# Patient Record
Sex: Female | Born: 1955
Health system: Southern US, Community
[De-identification: ages and names within clinical notes are randomized; demographics above are authoritative.]

## PROBLEM LIST (undated history)

## (undated) DIAGNOSIS — I10 Essential (primary) hypertension: Secondary | ICD-10-CM

## (undated) DIAGNOSIS — F419 Anxiety disorder, unspecified: Secondary | ICD-10-CM

## (undated) DIAGNOSIS — E119 Type 2 diabetes mellitus without complications: Secondary | ICD-10-CM

## (undated) HISTORY — PX: ABDOMINAL HYSTERECTOMY: SHX81

## (undated) HISTORY — DX: Anxiety disorder, unspecified: F41.9

## (undated) HISTORY — PX: FRACTURE SURGERY: SHX138

---

## 1998-05-05 ENCOUNTER — Ambulatory Visit (HOSPITAL_BASED_OUTPATIENT_CLINIC_OR_DEPARTMENT_OTHER): Admission: RE | Admit: 1998-05-05 | Discharge: 1998-05-05 | Payer: Self-pay | Admitting: Orthopedic Surgery

## 1998-07-01 ENCOUNTER — Ambulatory Visit (HOSPITAL_COMMUNITY): Admission: RE | Admit: 1998-07-01 | Discharge: 1998-07-01 | Payer: Self-pay | Admitting: Gastroenterology

## 1998-07-17 ENCOUNTER — Ambulatory Visit (HOSPITAL_COMMUNITY): Admission: RE | Admit: 1998-07-17 | Discharge: 1998-07-17 | Payer: Self-pay | Admitting: Gastroenterology

## 1998-07-17 ENCOUNTER — Encounter: Payer: Self-pay | Admitting: Gastroenterology

## 1998-07-23 ENCOUNTER — Encounter: Payer: Self-pay | Admitting: General Surgery

## 1998-07-24 ENCOUNTER — Encounter: Payer: Self-pay | Admitting: General Surgery

## 1998-07-24 ENCOUNTER — Ambulatory Visit (HOSPITAL_COMMUNITY): Admission: RE | Admit: 1998-07-24 | Discharge: 1998-07-25 | Payer: Self-pay | Admitting: General Surgery

## 2001-04-28 ENCOUNTER — Emergency Department (HOSPITAL_COMMUNITY): Admission: EM | Admit: 2001-04-28 | Discharge: 2001-04-28 | Payer: Self-pay | Admitting: Emergency Medicine

## 2001-04-30 ENCOUNTER — Emergency Department (HOSPITAL_COMMUNITY): Admission: EM | Admit: 2001-04-30 | Discharge: 2001-04-30 | Payer: Self-pay | Admitting: Emergency Medicine

## 2001-05-02 ENCOUNTER — Emergency Department (HOSPITAL_COMMUNITY): Admission: EM | Admit: 2001-05-02 | Discharge: 2001-05-02 | Payer: Self-pay | Admitting: *Deleted

## 2007-11-26 ENCOUNTER — Inpatient Hospital Stay (HOSPITAL_COMMUNITY): Admission: EM | Admit: 2007-11-26 | Discharge: 2007-11-29 | Payer: Self-pay | Admitting: Emergency Medicine

## 2008-01-02 ENCOUNTER — Ambulatory Visit: Payer: Self-pay | Admitting: Internal Medicine

## 2008-01-16 ENCOUNTER — Ambulatory Visit: Payer: Self-pay | Admitting: Internal Medicine

## 2008-01-16 ENCOUNTER — Ambulatory Visit: Payer: Self-pay | Admitting: *Deleted

## 2008-03-12 ENCOUNTER — Ambulatory Visit: Payer: Self-pay | Admitting: Internal Medicine

## 2008-03-12 ENCOUNTER — Encounter (INDEPENDENT_AMBULATORY_CARE_PROVIDER_SITE_OTHER): Payer: Self-pay | Admitting: Family Medicine

## 2008-03-12 LAB — CONVERTED CEMR LAB
ALT: 16 units/L (ref 0–35)
Alkaline Phosphatase: 64 units/L (ref 39–117)
CO2: 27 meq/L (ref 19–32)
LDL Cholesterol: 134 mg/dL — ABNORMAL HIGH (ref 0–99)
Potassium: 4.9 meq/L (ref 3.5–5.3)
Sodium: 141 meq/L (ref 135–145)
Total Bilirubin: 0.4 mg/dL (ref 0.3–1.2)
Total Protein: 7 g/dL (ref 6.0–8.3)
VLDL: 15 mg/dL (ref 0–40)

## 2008-04-23 ENCOUNTER — Ambulatory Visit: Payer: Self-pay | Admitting: Internal Medicine

## 2008-05-07 ENCOUNTER — Ambulatory Visit: Payer: Self-pay | Admitting: Internal Medicine

## 2008-08-15 ENCOUNTER — Emergency Department (HOSPITAL_COMMUNITY): Admission: EM | Admit: 2008-08-15 | Discharge: 2008-08-15 | Payer: Self-pay | Admitting: Emergency Medicine

## 2008-12-11 ENCOUNTER — Ambulatory Visit: Payer: Self-pay | Admitting: Internal Medicine

## 2008-12-11 ENCOUNTER — Encounter (INDEPENDENT_AMBULATORY_CARE_PROVIDER_SITE_OTHER): Payer: Self-pay | Admitting: Family Medicine

## 2008-12-11 LAB — CONVERTED CEMR LAB
ALT: 12 units/L (ref 0–35)
AST: 12 units/L (ref 0–37)
Albumin: 4.6 g/dL (ref 3.5–5.2)
Alkaline Phosphatase: 66 units/L (ref 39–117)
HDL: 53 mg/dL (ref >39)
LDL Cholesterol: 98 mg/dL (ref 0–99)
Potassium: 4.4 meq/L (ref 3.5–5.3)
Sodium: 144 meq/L (ref 135–145)
Total Protein: 7.3 g/dL (ref 6.0–8.3)

## 2008-12-25 ENCOUNTER — Ambulatory Visit: Payer: Self-pay | Admitting: Internal Medicine

## 2008-12-25 ENCOUNTER — Encounter (INDEPENDENT_AMBULATORY_CARE_PROVIDER_SITE_OTHER): Payer: Self-pay | Admitting: Adult Health

## 2008-12-25 LAB — CONVERTED CEMR LAB: Microalb, Ur: 1.28 mg/dL (ref 0.00–1.89)

## 2009-03-24 ENCOUNTER — Ambulatory Visit: Payer: Self-pay | Admitting: Internal Medicine

## 2009-03-24 ENCOUNTER — Encounter (INDEPENDENT_AMBULATORY_CARE_PROVIDER_SITE_OTHER): Payer: Self-pay | Admitting: Adult Health

## 2009-03-25 ENCOUNTER — Ambulatory Visit (HOSPITAL_COMMUNITY): Admission: RE | Admit: 2009-03-25 | Discharge: 2009-03-25 | Payer: Self-pay | Admitting: Family Medicine

## 2009-04-01 ENCOUNTER — Encounter: Admission: RE | Admit: 2009-04-01 | Discharge: 2009-04-01 | Payer: Self-pay | Admitting: Family Medicine

## 2009-05-12 ENCOUNTER — Encounter: Admission: RE | Admit: 2009-05-12 | Discharge: 2009-05-12 | Payer: Self-pay | Admitting: Infectious Diseases

## 2009-06-24 ENCOUNTER — Ambulatory Visit: Payer: Self-pay | Admitting: Internal Medicine

## 2009-06-24 ENCOUNTER — Encounter (INDEPENDENT_AMBULATORY_CARE_PROVIDER_SITE_OTHER): Payer: Self-pay | Admitting: Adult Health

## 2009-06-24 LAB — CONVERTED CEMR LAB
ALT: 9 units/L (ref 0–35)
AST: 13 units/L (ref 0–37)
Calcium: 9.8 mg/dL (ref 8.4–10.5)
Chloride: 108 meq/L (ref 96–112)
Creatinine, Ser: 0.58 mg/dL (ref 0.40–1.20)
Potassium: 4.2 meq/L (ref 3.5–5.3)
Total CHOL/HDL Ratio: 3.2

## 2009-10-12 ENCOUNTER — Ambulatory Visit: Payer: Self-pay | Admitting: Internal Medicine

## 2010-01-20 ENCOUNTER — Ambulatory Visit: Payer: Self-pay | Admitting: Internal Medicine

## 2010-01-20 ENCOUNTER — Encounter (INDEPENDENT_AMBULATORY_CARE_PROVIDER_SITE_OTHER): Payer: Self-pay | Admitting: Adult Health

## 2010-01-20 LAB — CONVERTED CEMR LAB
ALT: 14 units/L (ref 0–35)
AST: 18 units/L (ref 0–37)
Alkaline Phosphatase: 67 units/L (ref 39–117)
Calcium: 9.8 mg/dL (ref 8.4–10.5)
Chloride: 106 meq/L (ref 96–112)
Creatinine, Ser: 0.7 mg/dL (ref 0.40–1.20)
LDL Cholesterol: 101 mg/dL — ABNORMAL HIGH (ref 0–99)
Rhuematoid fact SerPl-aCnc: <20 intl units/mL (ref 0–20)
Sed Rate: 3 mm/hr (ref 0–22)
Total Bilirubin: 0.3 mg/dL (ref 0.3–1.2)
Total CHOL/HDL Ratio: 2.8
Uric Acid, Serum: 4.1 mg/dL (ref 2.4–7.0)
VLDL: 13 mg/dL (ref 0–40)
Vit D, 25-Hydroxy: 29 ng/mL — ABNORMAL LOW (ref 30–89)

## 2010-02-04 ENCOUNTER — Telehealth (INDEPENDENT_AMBULATORY_CARE_PROVIDER_SITE_OTHER): Payer: Self-pay | Admitting: *Deleted

## 2010-04-15 ENCOUNTER — Ambulatory Visit (HOSPITAL_COMMUNITY): Admission: RE | Admit: 2010-04-15 | Discharge: 2010-04-15 | Payer: Self-pay | Admitting: Family Medicine

## 2010-10-19 NOTE — Progress Notes (Signed)
Summary: triage/stomach pain/burning  Phone Note Call from Patient   Caller: Patient Reason for Call: Talk to Nurse Summary of Call: Patient called 5/18 stating she was having severe stomach pains with burning in chest..she described her abd pain as sharp and stated it was unbearable.Marland KitchenMarland KitchenThis all started 24 hours prior.  Patient has a history of HTN and DM..she takes protonix for acid reflux.Marland KitchenMarland KitchenShe states she had a BM today and it was loose.Marland KitchenMarland KitchenAdvised patient to go to UC since we did not have any appointments.Marland KitchenMarland KitchenShe stated she would... 1800  Called patient back with no answer. 02/04/10 Called patient, she is 100% better today.Marland KitchenMarland KitchenDid not go to UC...feels it must have been a virus...patient will call back if needed. Initial call taken by: Conchita Paris,  Feb 04, 2010 11:00 AM

## 2010-11-11 ENCOUNTER — Encounter (INDEPENDENT_AMBULATORY_CARE_PROVIDER_SITE_OTHER): Payer: Self-pay | Admitting: *Deleted

## 2010-11-11 LAB — CONVERTED CEMR LAB
ALT: 17 units/L (ref 0–35)
AST: 17 units/L (ref 0–37)
Albumin: 4.7 g/dL (ref 3.5–5.2)
Alkaline Phosphatase: 82 units/L (ref 39–117)
Basophils Absolute: 0.1 10*3/uL (ref 0.0–0.1)
Basophils Relative: 1 % (ref 0–1)
Calcium: 10.6 mg/dL — ABNORMAL HIGH (ref 8.4–10.5)
Chloride: 102 meq/L (ref 96–112)
Creatinine, Ser: 0.8 mg/dL (ref 0.40–1.20)
Eosinophils Absolute: 0.1 10*3/uL (ref 0.0–0.7)
MCHC: 32.5 g/dL (ref 30.0–36.0)
Neutro Abs: 4.2 10*3/uL (ref 1.7–7.7)
Neutrophils Relative %: 57 % (ref 43–77)
Platelets: 230 10*3/uL (ref 150–400)
Potassium: 4.3 meq/L (ref 3.5–5.3)
RDW: 14.9 % (ref 11.5–15.5)

## 2010-11-23 ENCOUNTER — Encounter: Payer: Self-pay | Admitting: Internal Medicine

## 2011-02-01 NOTE — Discharge Summary (Signed)
Nicole Yates, Nicole Yates                ACCOUNT NO.:  [id number]   MEDICAL RECORD NO.:  [id number]          PATIENT TYPE:  INP   LOCATION:  1304                         FACILITY:  [facility]   PHYSICIAN:  Elliot Cousin, M.D.    DATE OF BIRTH:  02/03/1956   DATE OF ADMISSION:  11/26/2007  DATE OF DISCHARGE:  11/29/2007                               DISCHARGE SUMMARY   DISCHARGE DIAGNOSES:  1. Newly diagnosed type 2 diabetes mellitus.  The patient's venous      glucose at the time of the initial hospital assessment was 1595.  2. Possibly early diabetic ketoacidosis.  3. Hypertension.  4. Hyperlipidemia.  5. Chronic obstructive pulmonary disease.  6. Bilateral adrenal nodules, appear to be benign in nature.  7. Low TSH, and normal free T4.   DISCHARGE MEDICATIONS:  1. Glipizide 10 mg 1/2 tablet b.i.d.  2. Metformin 500 mg b.i.d.  3. NPH insulin 7 units b.i.d.  4. Lisinopril 5 mg daily.  5. Lovastatin 20 mg at bedtime.  6. Combivent MDI 2 puffs q.4 h. as needed.  7. Aspirin 81 mg daily.   DISCHARGE DISPOSITION:  The patient is being discharged to home in  improved and stable condition.  She was advised to follow up with the  [facility] within 3 days.   CONSULTATIONS:  1. Registered dietician.  2. Clinical case Production designer, theatre/television/film.   HISTORY OF PRESENT ILLNESS:  The patient is a 55 year old woman with no  significant past medical history prior to this hospitalization, who  presented to the emergency department complaining of nausea and  vomiting.  When the patient was evaluated in the emergency department,  her blood glucose was found to be greater than 1500.  The patient was  therefore admitted for further evaluation and management.   For additional details, please see the dictated history and physical.   HOSPITAL COURSE:  1. Severe hyperglycemia/newly diagnosed type 2 diabetes mellitus/early      diabetic ketoacidosis.  The patient was started on the insulin drip      Glucommander  protocol.  She was admitted to the ICU for close      observation.  After approximately 24 or more hours on the      Glucommander, it was discontinued.  The patient was subsequently      started on Lantus insulin and sliding-scale NovoLog.  The doses of      Lantus and sliding-scale NovoLog were titrated accordingly and      appropriately.  Subsequently, glipizide and metformin were added to      the regimen.  Over the past 24 hours, the patient's capillary blood      glucose has been ranging from 150-350.  The patient will be      discharged to home on glipizide, metformin, and NPH insulin.      Because of the patient's financial constraints, Lantus was not      prescribed for home because of its expense.  If the patient is able      to see a physician at the [facility] within the next few  days, certainly the Lantus can be restarted and the NPH can be      discontinued.  The NPH is less expressive than Lantus.  During the      hospital course, the patient did receive diabetes teaching with      regards to her disease, diet, and home glucose monitoring.  The      registered dietician and the registered nurse provided the patient      with nutrition education.  The patient was also instructed on self      insulin injections.  She appears to have understood the education      well.  Of note, an aspirin at 81 mg daily was added for      cardioprotection.  The patient's hemoglobin A1c was found to be      elevated at 13.3.  2. Hypertension.  The patient was hypertensive during the hospital      course.  Lisinopril was added for management.  Over the past 24      hours, the patient's blood pressure has been well controlled on      lisinopril.  3. Dyslipidemia/hyperlipidemia.  The patient's fasting lipid profile      was assessed during the hospital course.  The results are as      follows:  Total cholesterol 279, HDL 45, and LDL 205.  The patient      was started on Zocor.  She  will be discharged to home on lovastatin      20 mg at bedtime.  4. COPD.  The patient was treated with Combivent MDI during the      hospitalization.  Per her history, she had recently stopped      smoking.  Her chest x-ray revealed changes consistent with COPD and      emphysema.  5. Nausea and vomiting.  The patient's nausea and vomiting were      thought to be secondary to severe hyperglycemia and early diabetic      ketoacidosis.  An abdominal CT scan was ordered, and it revealed      multiple bilateral adrenal nodules, nonspecific, left      nephrolithiasis, and no acute findings in the abdomen.  The CT scan      of the pelvis revealed no acute findings.  Given the incidental      finding of the adrenal nodules, a dedicated CT scan of the abdomen      was ordered.  Per the radiologist's interpretation, the multiple      bilateral adrenal nodules appeared to be benign in etiology,      possibly adenomas or possibly myelolipomas.  There were no acute      findings on the follow-up CT scan of the adrenal glands.  The      patient's nausea and vomiting resolved once the early DKA and      hyperglycemia resolved.  6. Low TSH, and normal free T4.  The patient's TSH was assessed and      found to be low at 0.255.  Her free T4, however, was within normal      limits at 1.80.  7. The patient  did receive intravenous contrast with the initial CT      scan of the abdomen and pelvis on November 26, 2007.  However, the      metformin was not withheld inadvertently.  The patient's renal      function has remained within normal limits.  Also,  the patient was      advised to go to [facility] to fill her prescriptions, as each      prescription will be $4.  The patient was also advised to follow up      with the [facility].  She also received a 3-day supply of      her medications prior to hospital discharge.   DISCHARGE LABORATORY DATA:  Sodium 138, potassium 3.6, chloride 105, CO2  26,  glucose 119, BUN 12, creatinine 0.6, calcium 8.3.  Hemoglobin 13.7,  platelets 203.  Hemoglobin A1c 13.3.      Elliot Cousin, M.D.  Electronically Signed     DF/MEDQ  D:  11/29/2007  T:  11/30/2007  Job:  [id number]   cc:   Melvern Banker  Fax: [phone]
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
## 2011-04-26 ENCOUNTER — Other Ambulatory Visit (HOSPITAL_COMMUNITY): Payer: Self-pay | Admitting: Family Medicine

## 2011-04-26 DIAGNOSIS — Z1231 Encounter for screening mammogram for malignant neoplasm of breast: Secondary | ICD-10-CM

## 2011-05-03 ENCOUNTER — Ambulatory Visit (HOSPITAL_COMMUNITY): Payer: Self-pay

## 2011-06-13 LAB — URINALYSIS, ROUTINE W REFLEX MICROSCOPIC
Bilirubin Urine: NEGATIVE
Hgb urine dipstick: NEGATIVE
Ketones, ur: NEGATIVE
Nitrite: NEGATIVE
pH: 6

## 2011-06-13 LAB — MAGNESIUM: Magnesium: 2.5

## 2011-06-13 LAB — CBC
HCT: 39.3
HCT: 49.1 — ABNORMAL HIGH
Hemoglobin: 13.7
MCHC: 31.7
MCV: 93.5
RBC: 5.25 — ABNORMAL HIGH
RDW: 14.3
WBC: 10.3

## 2011-06-13 LAB — BASIC METABOLIC PANEL
BUN: 12
CO2: 25
CO2: 26
CO2: 27
CO2: 31
Chloride: 101
Chloride: 105
Chloride: 107
Creatinine, Ser: 0.94
GFR calc Af Amer: >60
GFR calc non Af Amer: >60
GFR calc non Af Amer: >60
Glucose, Bld: 106 — ABNORMAL HIGH
Glucose, Bld: 119 — ABNORMAL HIGH
Glucose, Bld: 137 — ABNORMAL HIGH
Potassium: 3.5
Potassium: 3.6
Potassium: 3.6
Sodium: 135
Sodium: 138
Sodium: 142

## 2011-06-13 LAB — COMPREHENSIVE METABOLIC PANEL
AST: 23
BUN: 21
CO2: 21
Calcium: 9.6
Chloride: 84 — ABNORMAL LOW
Creatinine, Ser: 1.25 — ABNORMAL HIGH
GFR calc Af Amer: 54 — ABNORMAL LOW
GFR calc non Af Amer: 45 — ABNORMAL LOW
Glucose, Bld: 1595
Total Bilirubin: 1.1

## 2011-06-13 LAB — LIPID PANEL
Cholesterol: 279 — ABNORMAL HIGH
HDL: 45

## 2011-06-13 LAB — HEMOGLOBIN A1C
Hgb A1c MFr Bld: 13.3 — ABNORMAL HIGH
Mean Plasma Glucose: 396

## 2011-06-13 LAB — DIFFERENTIAL
Basophils Absolute: 0
Eosinophils Relative: 0
Lymphocytes Relative: 4 — ABNORMAL LOW
Lymphs Abs: 0.5 — ABNORMAL LOW
Neutro Abs: 9.6 — ABNORMAL HIGH
Neutrophils Relative %: 94 — ABNORMAL HIGH

## 2011-06-13 LAB — GLUCOSE, RANDOM
Glucose, Bld: 450 — ABNORMAL HIGH
Glucose, Bld: 703

## 2011-06-13 LAB — TSH: TSH: 0.255 — ABNORMAL LOW

## 2011-11-28 ENCOUNTER — Ambulatory Visit (HOSPITAL_COMMUNITY): Payer: Self-pay

## 2012-01-03 ENCOUNTER — Ambulatory Visit (HOSPITAL_COMMUNITY)
Admission: RE | Admit: 2012-01-03 | Discharge: 2012-01-03 | Disposition: A | Discharge status: Home / Self Care | Payer: Self-pay | Source: Ambulatory Visit | Attending: Family Medicine | Admitting: Family Medicine

## 2012-01-03 DIAGNOSIS — Z1231 Encounter for screening mammogram for malignant neoplasm of breast: Secondary | ICD-10-CM | POA: Insufficient documentation

## 2013-08-09 ENCOUNTER — Encounter (HOSPITAL_COMMUNITY): Payer: Self-pay | Admitting: Emergency Medicine

## 2013-08-09 ENCOUNTER — Emergency Department (HOSPITAL_COMMUNITY): Payer: Self-pay

## 2013-08-09 ENCOUNTER — Emergency Department (HOSPITAL_COMMUNITY)
Admission: EM | Admit: 2013-08-09 | Discharge: 2013-08-09 | Disposition: A | Discharge status: Home / Self Care | Payer: Self-pay | Attending: Emergency Medicine | Admitting: Emergency Medicine

## 2013-08-09 DIAGNOSIS — Y9301 Activity, walking, marching and hiking: Secondary | ICD-10-CM | POA: Insufficient documentation

## 2013-08-09 DIAGNOSIS — S93401A Sprain of unspecified ligament of right ankle, initial encounter: Secondary | ICD-10-CM

## 2013-08-09 DIAGNOSIS — E119 Type 2 diabetes mellitus without complications: Secondary | ICD-10-CM | POA: Insufficient documentation

## 2013-08-09 DIAGNOSIS — S93409A Sprain of unspecified ligament of unspecified ankle, initial encounter: Secondary | ICD-10-CM | POA: Insufficient documentation

## 2013-08-09 DIAGNOSIS — W108XXA Fall (on) (from) other stairs and steps, initial encounter: Secondary | ICD-10-CM | POA: Insufficient documentation

## 2013-08-09 DIAGNOSIS — Z87891 Personal history of nicotine dependence: Secondary | ICD-10-CM | POA: Insufficient documentation

## 2013-08-09 DIAGNOSIS — Y929 Unspecified place or not applicable: Secondary | ICD-10-CM | POA: Insufficient documentation

## 2013-08-09 DIAGNOSIS — I10 Essential (primary) hypertension: Secondary | ICD-10-CM | POA: Insufficient documentation

## 2013-08-09 DIAGNOSIS — X500XXA Overexertion from strenuous movement or load, initial encounter: Secondary | ICD-10-CM | POA: Insufficient documentation

## 2013-08-09 HISTORY — DX: Essential (primary) hypertension: I10

## 2013-08-09 HISTORY — DX: Type 2 diabetes mellitus without complications: E11.9

## 2013-08-09 MED ORDER — TRAMADOL HCL 50 MG PO TABS: 50.0000 mg | ORAL_TABLET | Freq: Four times a day (QID) | ORAL | Status: DC | PRN

## 2013-08-09 NOTE — ED Notes (Signed)
Pt states that she was going down stair when her feet slipped out from under her and fell down last three stairs and hit tile floor. Pt c/o right ankle injury she iced it last nioght and some swelling has subsided but today she isnt able to bear weight.

## 2013-08-09 NOTE — ED Provider Notes (Signed)
Medical screening examination/treatment/procedure(s) were performed by non-physician practitioner and as supervising physician I was immediately available for consultation/collaboration.    Tyashia Morrisette R Torres Hardenbrook, MD 08/09/13 1642 

## 2013-08-09 NOTE — ED Provider Notes (Signed)
CSN: [id number]     Arrival date & time 08/09/13  1035 History   First MD Initiated Contact with Patient 08/09/13 1038     Chief Complaint  Patient presents with  . Ankle Pain    right   (Consider location/radiation/quality/duration/timing/severity/associated sxs/prior Treatment) HPI Comments: This is a 57 year old female who presents today with right ankle pain after a fall last night. She reports she was walking down the stairs when she fell down the last 2-3 stairs. She had an inversion injury. She was able to ambulate on her ankle last night, but this morning woke up and the pain was worse. The pain is a throbbing pain without radiation. She used an Ace bandage for compression last night. She did not take Tylenol or Advil for the pain. She does believe that she has broken this ankle in the past. She otherwise feels well. No fever, chills, nausea, vomiting, numbness, weakness, paresthesias.  Patient is a 57 y.o. female presenting with ankle pain. The history is provided by the patient. No language interpreter was used.  Ankle Pain Associated symptoms: no fever     Past Medical History  Diagnosis Date  . Diabetes mellitus without complication   . Hypertension    Past Surgical History  Procedure Laterality Date  . Abdominal hysterectomy    . Fracture surgery     No family history on file. History  Substance Use Topics  . Smoking status: Former Games developer  . Smokeless tobacco: Not on file  . Alcohol Use: Yes     Comment: social   OB History   Grav Para Term Preterm Abortions TAB SAB Ect Mult Living                 Review of Systems  Constitutional: Negative for fever and chills.  Respiratory: Negative for shortness of breath.   Cardiovascular: Negative for chest pain.  Gastrointestinal: Negative for nausea, vomiting and abdominal pain.  Musculoskeletal: Positive for arthralgias, gait problem and joint swelling.  All other systems reviewed and are negative.    Allergies    Review of patient's allergies indicates not on file.  Home Medications  No current outpatient prescriptions on file. BP 150/95  Pulse 94  Temp(Src) 98.3 F (36.8 C) (Oral)  Resp 18  SpO2 97% Physical Exam  Nursing note and vitals reviewed. Constitutional: She is oriented to person, place, and time. She appears well-developed and well-nourished. No distress.  HENT:  Head: Normocephalic and atraumatic.  Right Ear: External ear normal.  Left Ear: External ear normal.  Nose: Nose normal.  Mouth/Throat: Oropharynx is clear and moist.  Eyes: Conjunctivae are normal.  Neck: Normal range of motion.  Cardiovascular: Normal rate, regular rhythm and normal heart sounds.   Pulmonary/Chest: Effort normal and breath sounds normal. No stridor. No respiratory distress. She has no wheezes. She has no rales.  Abdominal: Soft. She exhibits no distension.  Musculoskeletal: Normal range of motion.       Feet:  Bruising to later aspect of right foot. TTP over medial and lateral malleolus. Joint stable. Compartment soft. Neurovascularly intact. Sensation intact.   Neurological: She is alert and oriented to person, place, and time. She has normal strength.  Skin: Skin is warm and dry. She is not diaphoretic. No erythema.  Psychiatric: She has a normal mood and affect. Her behavior is normal.    ED Course  Procedures (including critical care time) Labs Review Labs Reviewed - No data to display Imaging Review Dg Ankle Complete  Right  08/09/2013   CLINICAL DATA:  Status post fall  EXAM: RIGHT ANKLE - COMPLETE 3+ VIEW  COMPARISON:  08/15/2008  FINDINGS: There is no evidence of fracture, dislocation, or joint effusion. Mild osteoarthritic changes are present otherwise, there is no evidence of arthropathy or other focal bone abnormality. Soft tissues are unremarkable.  IMPRESSION: Negative.   Electronically Signed   By: Salome Holmes M.D.   On: 08/09/2013 11:08    EKG Interpretation   None        MDM   1. Right ankle sprain, initial encounter    Imaging shows no fracture. Directed pt to ice injury, take acetaminophen or ibuprofen for pain, and to elevate and rest the injury when possible. Splinted ankle for support and comfort. She has crutches at home she will use PRN. Neurovascularly intact. Compartment soft. Sensation intact. Return instructions given. Vital signs stable for discharge. Patient / Family / Caregiver informed of clinical course, understand medical decision-making process, and agree with plan.  Mora Bellman, PA-C 08/09/13 1144

## 2013-08-09 NOTE — ED Notes (Signed)
Patient transported to X-ray 

## 2014-06-09 ENCOUNTER — Encounter (HOSPITAL_COMMUNITY): Payer: Self-pay | Admitting: Emergency Medicine

## 2014-06-09 ENCOUNTER — Inpatient Hospital Stay (HOSPITAL_COMMUNITY)
Admission: EM | Admit: 2014-06-09 | Discharge: 2014-06-12 | DRG: 871 | Disposition: A | Discharge status: Home / Self Care | Payer: Self-pay | Attending: Internal Medicine | Admitting: Internal Medicine

## 2014-06-09 ENCOUNTER — Emergency Department (HOSPITAL_COMMUNITY): Payer: Self-pay

## 2014-06-09 DIAGNOSIS — E1110 Type 2 diabetes mellitus with ketoacidosis without coma: Secondary | ICD-10-CM | POA: Diagnosis present

## 2014-06-09 DIAGNOSIS — E1310 Other specified diabetes mellitus with ketoacidosis without coma: Secondary | ICD-10-CM

## 2014-06-09 DIAGNOSIS — I10 Essential (primary) hypertension: Secondary | ICD-10-CM | POA: Diagnosis present

## 2014-06-09 DIAGNOSIS — R739 Hyperglycemia, unspecified: Secondary | ICD-10-CM | POA: Diagnosis present

## 2014-06-09 DIAGNOSIS — J189 Pneumonia, unspecified organism: Secondary | ICD-10-CM | POA: Diagnosis present

## 2014-06-09 DIAGNOSIS — E41 Nutritional marasmus: Secondary | ICD-10-CM | POA: Diagnosis present

## 2014-06-09 DIAGNOSIS — A419 Sepsis, unspecified organism: Principal | ICD-10-CM | POA: Diagnosis present

## 2014-06-09 DIAGNOSIS — IMO0002 Reserved for concepts with insufficient information to code with codable children: Secondary | ICD-10-CM

## 2014-06-09 DIAGNOSIS — E876 Hypokalemia: Secondary | ICD-10-CM | POA: Diagnosis present

## 2014-06-09 DIAGNOSIS — R680 Hypothermia, not associated with low environmental temperature: Secondary | ICD-10-CM | POA: Diagnosis present

## 2014-06-09 DIAGNOSIS — R64 Cachexia: Secondary | ICD-10-CM | POA: Diagnosis present

## 2014-06-09 DIAGNOSIS — E43 Unspecified severe protein-calorie malnutrition: Secondary | ICD-10-CM | POA: Diagnosis present

## 2014-06-09 DIAGNOSIS — K859 Acute pancreatitis without necrosis or infection, unspecified: Secondary | ICD-10-CM | POA: Diagnosis present

## 2014-06-09 DIAGNOSIS — D72829 Elevated white blood cell count, unspecified: Secondary | ICD-10-CM | POA: Diagnosis present

## 2014-06-09 DIAGNOSIS — E871 Hypo-osmolality and hyponatremia: Secondary | ICD-10-CM | POA: Diagnosis present

## 2014-06-09 LAB — I-STAT CHEM 8, ED
BUN: 34 mg/dL — ABNORMAL HIGH (ref 6–23)
CALCIUM ION: 1.13 mmol/L (ref 1.13–1.30)
Chloride: 100 mEq/L (ref 96–112)
Creatinine, Ser: 1 mg/dL (ref 0.50–1.10)
Glucose, Bld: >700 mg/dL (ref 70–99)
HCT: 48 % — ABNORMAL HIGH (ref 36.0–46.0)
HEMOGLOBIN: 16.3 g/dL — AB (ref 12.0–15.0)
Potassium: 4.4 mEq/L (ref 3.7–5.3)
Sodium: 129 mEq/L — ABNORMAL LOW (ref 137–147)
TCO2: 9 mmol/L (ref 0–100)

## 2014-06-09 LAB — BASIC METABOLIC PANEL
ANION GAP: 12 (ref 5–15)
Anion gap: 27 — ABNORMAL HIGH (ref 5–15)
Anion gap: 14 (ref 5–15)
BUN: 33 mg/dL — ABNORMAL HIGH (ref 6–23)
BUN: 28 mg/dL — ABNORMAL HIGH (ref 6–23)
BUN: 26 mg/dL — AB (ref 6–23)
CHLORIDE: 98 meq/L (ref 96–112)
CHLORIDE: 101 meq/L (ref 96–112)
CHLORIDE: 104 meq/L (ref 96–112)
CO2: 10 mEq/L — CL (ref 19–32)
CO2: 19 mEq/L (ref 19–32)
CO2: 18 meq/L — AB (ref 19–32)
CREATININE: 0.7 mg/dL (ref 0.50–1.10)
Calcium: 8.6 mg/dL (ref 8.4–10.5)
Calcium: 9 mg/dL (ref 8.4–10.5)
Calcium: 8.9 mg/dL (ref 8.4–10.5)
Creatinine, Ser: 0.83 mg/dL (ref 0.50–1.10)
Creatinine, Ser: 0.76 mg/dL (ref 0.50–1.10)
GFR calc non Af Amer: 75 mL/min — ABNORMAL LOW (ref >90)
GFR calc non Af Amer: 90 mL/min — ABNORMAL LOW (ref >90)
GFR calc non Af Amer: >90 mL/min (ref >90)
GFR, EST AFRICAN AMERICAN: 87 mL/min — AB (ref >90)
GLUCOSE: 341 mg/dL — AB (ref 70–99)
Glucose, Bld: 169 mg/dL — ABNORMAL HIGH (ref 70–99)
Glucose, Bld: 140 mg/dL — ABNORMAL HIGH (ref 70–99)
POTASSIUM: 4.1 meq/L (ref 3.7–5.3)
POTASSIUM: 4 meq/L (ref 3.7–5.3)
POTASSIUM: 3.9 meq/L (ref 3.7–5.3)
Sodium: 135 mEq/L — ABNORMAL LOW (ref 137–147)
Sodium: 134 mEq/L — ABNORMAL LOW (ref 137–147)
Sodium: 134 mEq/L — ABNORMAL LOW (ref 137–147)

## 2014-06-09 LAB — GLUCOSE, CAPILLARY
GLUCOSE-CAPILLARY: 289 mg/dL — AB (ref 70–99)
GLUCOSE-CAPILLARY: 144 mg/dL — AB (ref 70–99)
GLUCOSE-CAPILLARY: 153 mg/dL — AB (ref 70–99)
Glucose-Capillary: 529 mg/dL — ABNORMAL HIGH (ref 70–99)
Glucose-Capillary: 432 mg/dL — ABNORMAL HIGH (ref 70–99)
Glucose-Capillary: 222 mg/dL — ABNORMAL HIGH (ref 70–99)
Glucose-Capillary: 178 mg/dL — ABNORMAL HIGH (ref 70–99)
Glucose-Capillary: 142 mg/dL — ABNORMAL HIGH (ref 70–99)

## 2014-06-09 LAB — BLOOD GAS, VENOUS
ACID-BASE DEFICIT: 20.8 mmol/L — AB (ref 0.0–2.0)
Bicarbonate: 7.6 mEq/L — ABNORMAL LOW (ref 20.0–24.0)
O2 Saturation: 87.5 %
PCO2 VEN: 23.6 mmHg — AB (ref 45.0–50.0)
PH VEN: 7.135 — AB (ref 7.250–7.300)
PO2 VEN: 70.5 mmHg — AB (ref 30.0–45.0)
Patient temperature: 98.6
TCO2: 7.1 mmol/L (ref 0–100)

## 2014-06-09 LAB — I-STAT TROPONIN, ED: TROPONIN I, POC: 0.01 ng/mL (ref 0.00–0.08)

## 2014-06-09 LAB — COMPREHENSIVE METABOLIC PANEL
ALT: 18 U/L (ref 0–35)
AST: 15 U/L (ref 0–37)
Albumin: 3.7 g/dL (ref 3.5–5.2)
Alkaline Phosphatase: 162 U/L — ABNORMAL HIGH (ref 39–117)
BUN: 39 mg/dL — ABNORMAL HIGH (ref 6–23)
CALCIUM: 9.4 mg/dL (ref 8.4–10.5)
CO2: <7 mEq/L — CL (ref 19–32)
CREATININE: 1 mg/dL (ref 0.50–1.10)
Chloride: 81 mEq/L — ABNORMAL LOW (ref 96–112)
GFR calc Af Amer: 70 mL/min — ABNORMAL LOW (ref >90)
GFR, EST NON AFRICAN AMERICAN: 60 mL/min — AB (ref >90)
GLUCOSE: 795 mg/dL — AB (ref 70–99)
Potassium: 4.6 mEq/L (ref 3.7–5.3)
SODIUM: 130 meq/L — AB (ref 137–147)
TOTAL PROTEIN: 7.4 g/dL (ref 6.0–8.3)
Total Bilirubin: 0.3 mg/dL (ref 0.3–1.2)

## 2014-06-09 LAB — URINALYSIS, ROUTINE W REFLEX MICROSCOPIC
Bilirubin Urine: NEGATIVE
Glucose, UA: >1000 mg/dL — AB
Ketones, ur: >80 mg/dL — AB
LEUKOCYTES UA: NEGATIVE
NITRITE: NEGATIVE
PROTEIN: NEGATIVE mg/dL
SPECIFIC GRAVITY, URINE: 1.027 (ref 1.005–1.030)
UROBILINOGEN UA: 0.2 mg/dL (ref 0.0–1.0)
pH: 5 (ref 5.0–8.0)

## 2014-06-09 LAB — CBG MONITORING, ED: GLUCOSE-CAPILLARY: 549 mg/dL — AB (ref 70–99)

## 2014-06-09 LAB — CBC
HEMATOCRIT: 50.7 % — AB (ref 36.0–46.0)
HEMATOCRIT: 41 % (ref 36.0–46.0)
HEMOGLOBIN: 15 g/dL (ref 12.0–15.0)
Hemoglobin: 17.4 g/dL — ABNORMAL HIGH (ref 12.0–15.0)
MCH: 30.5 pg (ref 26.0–34.0)
MCH: 30.2 pg (ref 26.0–34.0)
MCHC: 34.3 g/dL (ref 30.0–36.0)
MCHC: 36.6 g/dL — AB (ref 30.0–36.0)
MCV: 88.8 fL (ref 78.0–100.0)
MCV: 82.7 fL (ref 78.0–100.0)
Platelets: 287 10*3/uL (ref 150–400)
Platelets: 168 10*3/uL (ref 150–400)
RBC: 5.71 MIL/uL — ABNORMAL HIGH (ref 3.87–5.11)
RBC: 4.96 MIL/uL (ref 3.87–5.11)
RDW: 13.4 % (ref 11.5–15.5)
RDW: 13.2 % (ref 11.5–15.5)
WBC: 18.4 10*3/uL — AB (ref 4.0–10.5)
WBC: 18.7 10*3/uL — ABNORMAL HIGH (ref 4.0–10.5)

## 2014-06-09 LAB — PHOSPHORUS: Phosphorus: 7.5 mg/dL — ABNORMAL HIGH (ref 2.3–4.6)

## 2014-06-09 LAB — I-STAT CG4 LACTIC ACID, ED: Lactic Acid, Venous: 3.74 mmol/L — ABNORMAL HIGH (ref 0.5–2.2)

## 2014-06-09 LAB — URINE MICROSCOPIC-ADD ON

## 2014-06-09 LAB — LIPASE, BLOOD: LIPASE: 216 U/L — AB (ref 11–59)

## 2014-06-09 LAB — PROTIME-INR
INR: 1.09 (ref 0.00–1.49)
PROTHROMBIN TIME: 14.1 s (ref 11.6–15.2)

## 2014-06-09 LAB — APTT: APTT: 24 s (ref 24–37)

## 2014-06-09 LAB — MRSA PCR SCREENING: MRSA by PCR: NEGATIVE

## 2014-06-09 LAB — KETONES, QUALITATIVE

## 2014-06-09 LAB — MAGNESIUM: Magnesium: 2.3 mg/dL (ref 1.5–2.5)

## 2014-06-09 MED ORDER — INSULIN REGULAR HUMAN 100 UNIT/ML IJ SOLN: INTRAMUSCULAR | Status: DC

## 2014-06-09 MED ORDER — SODIUM CHLORIDE 0.9 % IV SOLN: INTRAVENOUS | Status: DC

## 2014-06-09 MED ORDER — SODIUM CHLORIDE 0.9 % IV SOLN
INTRAVENOUS | Status: DC
  Administered 2014-06-09: 5.4 [IU]/h via INTRAVENOUS
  Filled 2014-06-09: qty 2.5

## 2014-06-09 MED ORDER — SODIUM CHLORIDE 0.9 % IV SOLN
1000.0000 mL | Freq: Once | INTRAVENOUS | Status: AC
  Administered 2014-06-09: 1000 mL via INTRAVENOUS

## 2014-06-09 MED ORDER — ONDANSETRON HCL 4 MG/2ML IJ SOLN: 4.0000 mg | Freq: Three times a day (TID) | INTRAMUSCULAR | Status: DC | PRN

## 2014-06-09 MED ORDER — POTASSIUM CHLORIDE 10 MEQ/100ML IV SOLN
10.0000 meq | INTRAVENOUS | Status: AC
  Administered 2014-06-09 (×2): 10 meq via INTRAVENOUS
  Filled 2014-06-09 (×2): qty 100

## 2014-06-09 MED ORDER — ALBUTEROL SULFATE (2.5 MG/3ML) 0.083% IN NEBU: 2.5000 mg | INHALATION_SOLUTION | RESPIRATORY_TRACT | Status: DC | PRN

## 2014-06-09 MED ORDER — DEXTROSE-NACL 5-0.45 % IV SOLN: INTRAVENOUS | Status: DC

## 2014-06-09 MED ORDER — ONDANSETRON HCL 4 MG/2ML IJ SOLN
4.0000 mg | Freq: Once | INTRAMUSCULAR | Status: AC
  Administered 2014-06-09: 4 mg via INTRAVENOUS
  Filled 2014-06-09: qty 2

## 2014-06-09 MED ORDER — TRAMADOL HCL 50 MG PO TABS
50.0000 mg | ORAL_TABLET | Freq: Four times a day (QID) | ORAL | Status: DC | PRN
Start: 2014-06-09 — End: 2014-06-12
  Administered 2014-06-09: 50 mg via ORAL
  Filled 2014-06-09: qty 1

## 2014-06-09 MED ORDER — MORPHINE SULFATE 2 MG/ML IJ SOLN: 1.0000 mg | INTRAMUSCULAR | Status: DC | PRN

## 2014-06-09 MED ORDER — DEXTROSE 5 % IV SOLN
1.0000 g | Freq: Once | INTRAVENOUS | Status: AC
  Administered 2014-06-09: 1 g via INTRAVENOUS
  Filled 2014-06-09: qty 10

## 2014-06-09 MED ORDER — DEXTROSE 5 % IV SOLN
500.0000 mg | Freq: Once | INTRAVENOUS | Status: AC
  Administered 2014-06-09: 500 mg via INTRAVENOUS
  Filled 2014-06-09: qty 500

## 2014-06-09 MED ORDER — DEXTROSE 50 % IV SOLN: 25.0000 mL | INTRAVENOUS | Status: DC | PRN

## 2014-06-09 MED ORDER — SODIUM CHLORIDE 0.9 % IV SOLN
1000.0000 mL | INTRAVENOUS | Status: DC
  Administered 2014-06-09: 1000 mL via INTRAVENOUS

## 2014-06-09 MED ORDER — INFLUENZA VAC SPLIT QUAD 0.5 ML IM SUSY
0.5000 mL | PREFILLED_SYRINGE | INTRAMUSCULAR | Status: AC | PRN
  Administered 2014-06-12: 0.5 mL via INTRAMUSCULAR
  Filled 2014-06-09 (×2): qty 0.5

## 2014-06-09 MED ORDER — DEXTROSE-NACL 5-0.45 % IV SOLN
INTRAVENOUS | Status: DC
  Administered 2014-06-09: 1000 mL via INTRAVENOUS

## 2014-06-09 MED ORDER — ENOXAPARIN SODIUM 40 MG/0.4ML ~~LOC~~ SOLN
40.0000 mg | SUBCUTANEOUS | Status: DC
  Administered 2014-06-09 – 2014-06-11 (×3): 40 mg via SUBCUTANEOUS
  Filled 2014-06-09 (×4): qty 0.4

## 2014-06-09 MED ORDER — HYDRALAZINE HCL 20 MG/ML IJ SOLN
5.0000 mg | INTRAMUSCULAR | Status: DC | PRN
  Administered 2014-06-09 – 2014-06-10 (×2): 5 mg via INTRAVENOUS
  Filled 2014-06-09 (×2): qty 1

## 2014-06-09 MED ORDER — DEXTROSE 5 % IV SOLN
1.0000 g | INTRAVENOUS | Status: DC
  Administered 2014-06-10 – 2014-06-12 (×3): 1 g via INTRAVENOUS
  Filled 2014-06-09 (×3): qty 10

## 2014-06-09 MED ORDER — ONDANSETRON HCL 4 MG/2ML IJ SOLN
4.0000 mg | Freq: Four times a day (QID) | INTRAMUSCULAR | Status: DC | PRN
  Administered 2014-06-10 – 2014-06-12 (×6): 4 mg via INTRAVENOUS
  Filled 2014-06-09 (×6): qty 2

## 2014-06-09 MED ORDER — PANTOPRAZOLE SODIUM 40 MG IV SOLR
40.0000 mg | Freq: Once | INTRAVENOUS | Status: AC
Start: 2014-06-09 — End: 2014-06-09
  Administered 2014-06-09: 40 mg via INTRAVENOUS
  Filled 2014-06-09: qty 40

## 2014-06-09 MED ORDER — DEXTROSE 5 % IV SOLN
500.0000 mg | INTRAVENOUS | Status: DC
  Administered 2014-06-10 – 2014-06-12 (×3): 500 mg via INTRAVENOUS
  Filled 2014-06-09 (×3): qty 500

## 2014-06-09 NOTE — Progress Notes (Signed)
UR completed 

## 2014-06-09 NOTE — H&P (Addendum)
[facility] History and Physical  Nicole Yates YNW:[id number] DOB: 1956/03/29 DOA: 06/09/2014  Referring physician: ED physician PCP: No primary provider on file.   Chief Complaint: cough, weakness   HPI:  Pt is 58 yo female with remote history of diabetes, diet controlled, presented to [facility] ED with main concern of several days duration of progressively worsening productive cough of clear sputum, fevers as high as 100 F, chills, malaise, poor oral intake. This has progressed and she reports several episodes of non bloody vomiting over the past 24 hours prior to this admission. Pt reports she takes no medications and was at one point on diabetic meds but has not taken any over the past year. She denies chest pain, shortness of breath, no other specific abd or urinary concerns. In Ed, pt noted to be slightly hypothermic with T 96.8 F, tachycardic with HR 109, with CBG > 600, initial BMP notable for CO2 < 7, Na 129, WBC 18.7, Hg 16.3, along with other metabolic derangements noted below. TRH asked to admit for further evaluation.   Assessment and Plan: Active Problems:   DKA - with initial AG 22 - admit to SDU - place on glucose stabilizer protocol, insulin drip, IVF - will need 2 runs of KCl via IV  - allow clear liquids for now and monitor AG - BMP Q2 hours until gap closes   Sepsis  - pt satisfies criteria for sepsis given hypothermia, tachycardia, leukocytosis, elevated lactic acid - based on the presenting symptoms appears that source is PNA - will treat with ABX Rocephin and Zithromax  - continue IVF, analgesia as needed - sputum analysis requested, blood and urine culture pending    PNA - based on clinical symptoms but not evident of CXR - will treat with Zithro and Rocephin as noted above - sputum culture, urine legionella and strep penumo requested - continue oxygen as needed    Leukocytosis - likely from PNA - ABX as noted above - repeat CBC in AM   DM, likely  uncontrolled - pending A1C  - diabetic educator consultation requested    HTN, accelerated - place on hydralazine as needed    Hemoconcentration, Hg 17.4 - from dehydration - IVF and repeat CBC in Am   Acute pancreatitis - likely from DKA, pt currently denies abd pain - will provide analgesia and antiemetics as needed  - repeat lipase in AM   Severe PCM - possibly from uncontrolled DM - pt cachectic - will provide nutritional supplements once pt tolerating PO   Hyponatremia - from hyperglycemia  - IVF and repeat BMP in AM  LOVENOX for DVT PROPHYLAXIS  Radiological Exams on Admission: Dg Chest Port 1 View  06/09/2014  No active disease.    Code Status: Full Family Communication: Pt at bedside Disposition Plan: Admit for further evaluation     Review of Systems:  Constitutional: Negative for diaphoresis.  HENT: Negative for hearing loss, ear pain, nosebleeds, congestion, sore throat, neck pain, tinnitus and ear discharge.   Eyes: Negative for blurred vision, double vision, photophobia, pain, discharge and redness.  Respiratory: Negative for wheezing and stridor.   Cardiovascular: Negative for chest pain, palpitations, orthopnea, claudication and leg swelling.  Gastrointestinal: Negative for abdominal pain.  Genitourinary: Negative for hematuria and flank pain.  Musculoskeletal: Negative for myalgias, back pain, joint pain and falls.  Skin: Negative for itching and rash.  Neurological: Negative for dizziness and weakness.  Endo/Heme/Allergies: Negative for environmental allergies and polydipsia. Does not bruise/bleed  easily.  Psychiatric/Behavioral: Negative for suicidal ideas. The patient is not nervous/anxious.      Past Medical History  Diagnosis Date  . Diabetes mellitus without complication   . Hypertension     Past Surgical History  Procedure Laterality Date  . Abdominal hysterectomy    . Fracture surgery      Social History:  reports that she has quit  smoking. She has never used smokeless tobacco. She reports that she drinks alcohol. Her drug history is not on file.  Allergies  Allergen Reactions  . Codeine Other (See Comments)    Childhood allergy, couldn't walk, talk  . Tetanus Toxoids Swelling    No pertinent family medical history   Prior to Admission medications   Medication Sig Start Date End Date Taking? Authorizing Provider  ibuprofen (ADVIL,MOTRIN) 200 MG tablet Take 400 mg by mouth every 6 (six) hours as needed for fever or moderate pain.   Yes Historical Provider, MD  Multiple Vitamin (MULTIVITAMIN WITH MINERALS) TABS tablet Take 1 tablet by mouth daily.   Yes Historical Provider, MD    Physical Exam: Filed Vitals:   06/09/14 1300 06/09/14 1315 06/09/14 1400 06/09/14 1414  BP: 178/85 194/79 162/74   Pulse: 102 101 99   Temp:   97.6 F (36.4 C)   TempSrc:      Resp: Height:     (1.6 m)  Weight:    46.8 kg (103 lb 2.8 oz)  SpO2: 100% 99% 97%     Physical Exam  Constitutional: Appears frail and tired, cachectic  HENT: Normocephalic. External right and left ear normal. Dry MM Eyes: Conjunctivae and EOM are normal. PERRLA, no scleral icterus.  Neck: Normal ROM. Neck supple. No JVD. No tracheal deviation. No thyromegaly.  CVS: Regular rhythm, tachycardic , S1/S2 +, no murmurs, no gallops, no carotid bruit.  Pulmonary: Effort and breath sounds normal, no stridor, mild rhonchi at bases  Abdominal: Soft. BS +,  no distension, tenderness, rebound or guarding.  Musculoskeletal: Normal range of motion. No edema and no tenderness.  Lymphadenopathy: No lymphadenopathy noted, cervical, inguinal. Neuro: Alert. Normal reflexes, muscle tone coordination. No cranial nerve deficit. Skin: Skin is warm and dry. No rash noted. Not diaphoretic. No erythema. No pallor.  Psychiatric: Normal mood and affect. Behavior, judgment, thought content normal.   Labs on Admission:  Basic Metabolic Panel:  Recent Labs Lab  06/09/14 1029 06/09/14 1030 06/09/14 1107  NA 130*  --  129*  K 4.6  --  4.4  CL 81*  --  100  CO2 <7*  --   --   GLUCOSE 795*  --  >700*  BUN 39*  --  34*  CREATININE 1.00  --  1.00  CALCIUM 9.4  --   --   MG  --  2.3  --   PHOS  --  7.5*  --    Liver Function Tests:  Recent Labs Lab 06/09/14 1029  AST 15  ALT 18  ALKPHOS 162*  BILITOT 0.3  PROT 7.4  ALBUMIN 3.7    Recent Labs Lab 06/09/14 1030  LIPASE 216*    CBC:  Recent Labs Lab 06/09/14 0901 06/09/14 1107  WBC 18.4*  --   HGB 17.4* 16.3*  HCT 50.7* 48.0*  MCV 88.8  --   PLT 287  --    CBG:  Recent Labs Lab 06/09/14 0834 06/09/14 1151 06/09/14 1300  GLUCAP >600* >600* 549*    EKG:  Normal sinus rhythm, no ST/T wave changes  Debbora Presto, MD  [facility] Pager [phone]  If 7PM-7AM, please contact night-coverage [email or web address] Password [email or web address] 06/09/2014, 2:52 PM

## 2014-06-09 NOTE — ED Notes (Signed)
Made Respiratory Tech that VBG needs to be ran

## 2014-06-09 NOTE — ED Notes (Signed)
Pt c/o not feeling well since Friday. Pt states that she has had fever (taking ibuprofen for it, non-productive cough, and vomiting. Pt CBG >600.

## 2014-06-09 NOTE — ED Provider Notes (Addendum)
CSN: [id number]     Arrival date & time 06/09/14  4098 History   First MD Initiated Contact with Patient 06/09/14 [phone]     Chief Complaint  Patient presents with  . Emesis  . Hyperglycemia  . Fever     (Consider location/radiation/quality/duration/timing/severity/associated sxs/prior Treatment) HPI The patient has been ill for approximately 4 days. She reports the symptoms started mild as cold symptoms with some cough and small amount of phlegm production. She reports 2 days ago she had a fever that was over 100. She reports it didn't seem like she was that sick but then 3 days ago she started vomiting. At onset it was only a few times but as of last night she threw up 7-8 times and now is having dry heaves. She is very weak and fatigued feeling. She denies any specific pain associated with this illness. She however feels awful. The patient has been off of her medications for approximately a year being unable to afford her diabetic medications. She reports also she was trying to be very compliant with diet and has lost 70 pounds and was hoping to control it with dietary means. The reported blood sugar prior to arrival is greater than 600. The patient reports she has not really been monitoring it very much over this past year. She only describes to me that it has been up and down but doesn't give specific numbers. The patient is denying chest pain, shortness of breath, any localizing abdominal pain or headache.  Past Medical History  Diagnosis Date  . Diabetes mellitus without complication   . Hypertension    Past Surgical History  Procedure Laterality Date  . Abdominal hysterectomy    . Fracture surgery     No family history on file. History  Substance Use Topics  . Smoking status: Former Games developer  . Smokeless tobacco: Not on file  . Alcohol Use: Yes     Comment: social   OB History   Grav Para Term Preterm Abortions TAB SAB Ect Mult Living                 Review of Systems  10  Systems reviewed and are negative for acute change except as noted in the HPI.   Allergies  Codeine and Tetanus toxoids  Home Medications   Prior to Admission medications   Medication Sig Start Date End Date Taking? Authorizing Provider  ibuprofen (ADVIL,MOTRIN) 200 MG tablet Take 400 mg by mouth every 6 (six) hours as needed for fever or moderate pain.   Yes Historical Provider, MD  Multiple Vitamin (MULTIVITAMIN WITH MINERALS) TABS tablet Take 1 tablet by mouth daily.   Yes Historical Provider, MD   BP 198/80  Pulse 104  Temp(Src) 96.8 F (36 C) (Rectal)  Resp 19  SpO2 99% Physical Exam The patient is an acutely ill-appearing 58 year old female. She is thin borderline cachectic. The skin is pale. She is holding an emesis bag and having some retching. Her mental status however is awake and appropriate giving full history with clear and cognitively intact speech. Head face: Normocephalic atraumatic Eyes: Slightly sunken extraocular motions are intact pupils are symmetric and responsive Nares: Patent no discharge Oral cavity: Mucous membranes are dry posterior records widely patent Neck: Supple no meningismus Lungs: Clear to auscultation no gross wheeze rhonchi rail. No acute respiratory distress Heart: Regular, no gross rub murmur Abdomen: Soft. Mild epigastric tenderness to palpation. No palpable mass. Back: Normal visual inspection Extremities: Thin. Lower extremities no peripheral  edema. No calf tenderness. Skin condition of the lower extremities is excellent without any wounds or cellulitis.  Skin: Cool, pale, dry. Neurologic: The patient's mental status is intact with normal cognitive function. She does have good recall. All movements are coordinated purposeful and symmetric. With normal expected motor function.   ED Course  Procedures (including critical care time) Labs Review Labs Reviewed  CBC - Abnormal; Notable for the following:    WBC 18.4 (*)    RBC 5.71 (*)     Hemoglobin 17.4 (*)    HCT 50.7 (*)    All other components within normal limits  LIPASE, BLOOD - Abnormal; Notable for the following:    Lipase 216 (*)    All other components within normal limits  PHOSPHORUS - Abnormal; Notable for the following:    Phosphorus 7.5 (*)    All other components within normal limits  KETONES, QUALITATIVE - Abnormal; Notable for the following:    Acetone, Bld MODERATE (*)    All other components within normal limits  COMPREHENSIVE METABOLIC PANEL - Abnormal; Notable for the following:    Sodium 130 (*)    Chloride 81 (*)    CO2 <7 (*)    Glucose, Bld 795 (*)    BUN 39 (*)    Alkaline Phosphatase 162 (*)    GFR calc non Af Amer 60 (*)    GFR calc Af Amer 70 (*)    All other components within normal limits  BLOOD GAS, VENOUS - Abnormal; Notable for the following:    pH, Ven 7.135 (*)    pCO2, Ven 23.6 (*)    pO2, Ven 70.5 (*)    Bicarbonate 7.6 (*)    Acid-base deficit 20.8 (*)    All other components within normal limits  I-STAT CG4 LACTIC ACID, ED - Abnormal; Notable for the following:    Lactic Acid, Venous 3.74 (*)    All other components within normal limits  I-STAT CHEM 8, ED - Abnormal; Notable for the following:    Sodium 129 (*)    BUN 34 (*)    Glucose, Bld >700 (*)    Hemoglobin 16.3 (*)    HCT 48.0 (*)    All other components within normal limits  CULTURE, BLOOD (ROUTINE X 2)  CULTURE, BLOOD (ROUTINE X 2)  APTT  PROTIME-INR  MAGNESIUM  URINALYSIS, ROUTINE W REFLEX MICROSCOPIC  CBG MONITORING, ED  Rosezena Sensor, ED    Imaging Review Dg Chest Port 1 View  06/09/2014   CLINICAL DATA:  Nausea, vomiting, cough  EXAM: PORTABLE CHEST - 1 VIEW  COMPARISON:  11/27/2007  FINDINGS: The lungs are hyperinflated likely secondary to COPD. There is no focal parenchymal opacity, pleural effusion, or pneumothorax. The heart and mediastinal contours are unremarkable.  The osseous structures are unremarkable.  IMPRESSION: No active disease.    Electronically Signed   By: Elige Ko   On: 06/09/2014 10:13     EKG Interpretation None     CRITICAL CARE Performed by: Arby Barrette   Total critical care time: 90  Critical care time was exclusive of separately billable procedures and treating other patients.  Critical care was necessary to treat or prevent imminent or life-threatening deterioration.  Critical care was time spent personally by me on the following activities: development of treatment plan with patient and/or surrogate as well as nursing, discussions with consultants, evaluation of patient's response to treatment, examination of patient, obtaining history from patient or surrogate, ordering and  performing treatments and interventions, ordering and review of laboratory studies, ordering and review of radiographic studies, pulse oximetry and re-evaluation of patient's condition.  MDM   Final diagnoses:  Diabetic ketoacidosis without coma associated with type 2 diabetes mellitus   the patient presents as outlined above with diagnostic studies confirmatory for a DKA in an untreated type II diabetic. Symptoms appear to started with a respiratory illness possibly a pneumonia or atypical pneumonia with cough and fever. The chest x-ray was interpreted as negative. However based on the patient's history and this as potential incipient illness, she's been treated Rocephin and Zithromax. The patient is acutely acidotic and hyperglycemic. She is also ketone positive. Hydration has been initiated in the emergency department with 3 L of fluids and initiation of insulin drip. Nausea control has been instituted with Zofran. The patient still has ill appearance however her mental status is awake and oriented, appropriately responsive to commands.     Arby Barrette, MD 06/09/14 1225  Patient reassessment. 3 L of fluids have infused and insulin drip has been initiated I reassessed the patient for her general condition she still in  the emergency department. The patient reports subjective improvement. She feels less nauseated and slightly improved. She continues to have ill appearance but is awake and responding appropriately to questions and commands.  Arby Barrette, MD 06/09/14 [phone]

## 2014-06-09 NOTE — ED Notes (Signed)
Patient is aware that we need a urine specimen.  

## 2014-06-09 NOTE — Progress Notes (Signed)
[facility] Community Liaison Stacy,  ° °Provided pt with a list of primary care resources and a [facility] Orange Card application to help patient establish primary care.  °

## 2014-06-10 DIAGNOSIS — J189 Pneumonia, unspecified organism: Secondary | ICD-10-CM

## 2014-06-10 DIAGNOSIS — E876 Hypokalemia: Secondary | ICD-10-CM

## 2014-06-10 DIAGNOSIS — D72829 Elevated white blood cell count, unspecified: Secondary | ICD-10-CM

## 2014-06-10 LAB — HEMOGLOBIN A1C
Hgb A1c MFr Bld: 19.1 % — ABNORMAL HIGH (ref <5.7)
MEAN PLASMA GLUCOSE: 501 mg/dL — AB (ref <117)

## 2014-06-10 LAB — LIPASE, BLOOD: LIPASE: 27 U/L (ref 11–59)

## 2014-06-10 LAB — GLUCOSE, CAPILLARY
GLUCOSE-CAPILLARY: 113 mg/dL — AB (ref 70–99)
GLUCOSE-CAPILLARY: 148 mg/dL — AB (ref 70–99)
GLUCOSE-CAPILLARY: 265 mg/dL — AB (ref 70–99)
Glucose-Capillary: 110 mg/dL — ABNORMAL HIGH (ref 70–99)
Glucose-Capillary: 117 mg/dL — ABNORMAL HIGH (ref 70–99)
Glucose-Capillary: 135 mg/dL — ABNORMAL HIGH (ref 70–99)
Glucose-Capillary: 138 mg/dL — ABNORMAL HIGH (ref 70–99)
Glucose-Capillary: 153 mg/dL — ABNORMAL HIGH (ref 70–99)
Glucose-Capillary: 159 mg/dL — ABNORMAL HIGH (ref 70–99)
Glucose-Capillary: 165 mg/dL — ABNORMAL HIGH (ref 70–99)
Glucose-Capillary: 178 mg/dL — ABNORMAL HIGH (ref 70–99)
Glucose-Capillary: 191 mg/dL — ABNORMAL HIGH (ref 70–99)
Glucose-Capillary: 198 mg/dL — ABNORMAL HIGH (ref 70–99)
Glucose-Capillary: 220 mg/dL — ABNORMAL HIGH (ref 70–99)
Glucose-Capillary: 224 mg/dL — ABNORMAL HIGH (ref 70–99)
Glucose-Capillary: 240 mg/dL — ABNORMAL HIGH (ref 70–99)
Glucose-Capillary: 281 mg/dL — ABNORMAL HIGH (ref 70–99)
Glucose-Capillary: 294 mg/dL — ABNORMAL HIGH (ref 70–99)

## 2014-06-10 LAB — BASIC METABOLIC PANEL
Anion gap: 13 (ref 5–15)
Anion gap: 12 (ref 5–15)
BUN: 23 mg/dL (ref 6–23)
BUN: 21 mg/dL (ref 6–23)
CHLORIDE: 101 meq/L (ref 96–112)
CO2: 19 mEq/L (ref 19–32)
CO2: 20 meq/L (ref 19–32)
Calcium: 9 mg/dL (ref 8.4–10.5)
Calcium: 9.1 mg/dL (ref 8.4–10.5)
Chloride: 104 mEq/L (ref 96–112)
Creatinine, Ser: 0.67 mg/dL (ref 0.50–1.10)
Creatinine, Ser: 0.63 mg/dL (ref 0.50–1.10)
GFR calc Af Amer: >90 mL/min (ref >90)
GFR calc Af Amer: >90 mL/min (ref >90)
GFR calc non Af Amer: >90 mL/min (ref >90)
GLUCOSE: 154 mg/dL — AB (ref 70–99)
Glucose, Bld: 121 mg/dL — ABNORMAL HIGH (ref 70–99)
POTASSIUM: 3.5 meq/L — AB (ref 3.7–5.3)
Potassium: 3.9 mEq/L (ref 3.7–5.3)
Sodium: 136 mEq/L — ABNORMAL LOW (ref 137–147)
Sodium: 133 mEq/L — ABNORMAL LOW (ref 137–147)

## 2014-06-10 LAB — CBC
HEMATOCRIT: 37.3 % (ref 36.0–46.0)
HEMOGLOBIN: 13.5 g/dL (ref 12.0–15.0)
MCH: 29.3 pg (ref 26.0–34.0)
MCHC: 36.2 g/dL — ABNORMAL HIGH (ref 30.0–36.0)
MCV: 81.1 fL (ref 78.0–100.0)
Platelets: 243 10*3/uL (ref 150–400)
RBC: 4.6 MIL/uL (ref 3.87–5.11)
RDW: 13 % (ref 11.5–15.5)
WBC: 18.7 10*3/uL — AB (ref 4.0–10.5)

## 2014-06-10 LAB — EXPECTORATED SPUTUM ASSESSMENT W REFEX TO RESP CULTURE

## 2014-06-10 LAB — STREP PNEUMONIAE URINARY ANTIGEN: Strep Pneumo Urinary Antigen: NEGATIVE

## 2014-06-10 MED ORDER — LIVING WELL WITH DIABETES BOOK
Freq: Once | Status: AC
  Administered 2014-06-10: 1
  Filled 2014-06-10: qty 1

## 2014-06-10 MED ORDER — INSULIN ASPART 100 UNIT/ML ~~LOC~~ SOLN: 0.0000 [IU] | SUBCUTANEOUS | Status: DC

## 2014-06-10 MED ORDER — PROMETHAZINE HCL 25 MG/ML IJ SOLN
12.5000 mg | Freq: Four times a day (QID) | INTRAMUSCULAR | Status: DC | PRN
  Administered 2014-06-12: 12.5 mg via INTRAVENOUS
  Filled 2014-06-10: qty 1

## 2014-06-10 MED ORDER — HYDRALAZINE HCL 20 MG/ML IJ SOLN
10.0000 mg | Freq: Four times a day (QID) | INTRAMUSCULAR | Status: DC | PRN
Start: 2014-06-10 — End: 2014-06-12
  Administered 2014-06-10: 10 mg via INTRAVENOUS
  Filled 2014-06-10: qty 1

## 2014-06-10 MED ORDER — SODIUM CHLORIDE 0.9 % IV SOLN
INTRAVENOUS | Status: DC
Start: 2014-06-10 — End: 2014-06-12
  Administered 2014-06-10 – 2014-06-11 (×3): via INTRAVENOUS

## 2014-06-10 MED ORDER — GLUCERNA SHAKE PO LIQD
237.0000 mL | Freq: Two times a day (BID) | ORAL | Status: DC
  Administered 2014-06-10 – 2014-06-12 (×4): 237 mL via ORAL
  Filled 2014-06-10 (×5): qty 237

## 2014-06-10 MED ORDER — INSULIN ASPART 100 UNIT/ML ~~LOC~~ SOLN
0.0000 [IU] | SUBCUTANEOUS | Status: DC
  Administered 2014-06-10 (×2): 8 [IU] via SUBCUTANEOUS
  Administered 2014-06-11: 5 [IU] via SUBCUTANEOUS
  Administered 2014-06-11: 8 [IU] via SUBCUTANEOUS
  Administered 2014-06-11: 3 [IU] via SUBCUTANEOUS
  Administered 2014-06-11: 8 [IU] via SUBCUTANEOUS
  Administered 2014-06-11: 5 [IU] via SUBCUTANEOUS
  Administered 2014-06-12: 2 [IU] via SUBCUTANEOUS
  Administered 2014-06-12: 3 [IU] via SUBCUTANEOUS
  Administered 2014-06-12: 11 [IU] via SUBCUTANEOUS
  Administered 2014-06-12: 5 [IU] via SUBCUTANEOUS

## 2014-06-10 MED ORDER — INSULIN GLARGINE 100 UNIT/ML ~~LOC~~ SOLN
5.0000 [IU] | Freq: Once | SUBCUTANEOUS | Status: AC
  Administered 2014-06-10: 5 [IU] via SUBCUTANEOUS
  Filled 2014-06-10: qty 0.05

## 2014-06-10 MED ORDER — AMLODIPINE BESYLATE 5 MG PO TABS
5.0000 mg | ORAL_TABLET | Freq: Every day | ORAL | Status: DC
  Administered 2014-06-10 – 2014-06-12 (×3): 5 mg via ORAL
  Filled 2014-06-10 (×3): qty 1

## 2014-06-10 MED ORDER — INSULIN STARTER KIT- SYRINGES (ENGLISH)
1.0000 | Freq: Once | Status: AC
  Administered 2014-06-10: 1
  Filled 2014-06-10: qty 1

## 2014-06-10 NOTE — Progress Notes (Signed)
Inpatient Diabetes Program Recommendations  AACE/ADA: New Consensus Statement on Inpatient Glycemic Control (2013)  Target Ranges:  Prepandial:   less than 140 mg/dL      Peak postprandial:   less than 180 mg/dL (1-2 hours)      Critically ill patients:  140 - 180 mg/dL   Reason for Visit: Diabetes Consult  Diabetes history: DM2 Outpatient Diabetes medications: None - previously on Lantus, metformin, glipizide Current orders for Inpatient glycemic control: Lantus 5 units once, still on GlucoStabilizer  58 year old female presents to [facility] with cough, weakness and vomiting. Hx DM2 diagnosed in 2012. Has not taken diabetes meds in over a year d/t finances. Has lost 70# in the past year. Found to be in DKA with initial AG of 22. Placed on Glucostabilizer. Recent labs show AG 12, CO2 20. Given Lantus 5 units. Eating CL diet. Will begin transitioning to Lantus and Novolog.   Inpatient Diabetes Program Recommendations Insulin - Basal: Lantus 5 units bid Correction (SSI): Novolog sensitive Q4 x 12 hours then tidwc and hs Insulin - Meal Coverage: May need small amount of meal coverage insulin when po intake increases HgbA1C: HgbA1C pending Diet: When advanced CHO mod med  Note: Pt interested in [facility] for MD to follow her diabetes. Has glucose meter, but needs strips and lancets.  Will order Living Well With Diabetes book and encouraged pt to view diabetes videos on pt ed channel. Interested in OP Diabetes Education and seems motivated to start taking care of herself with regards to diabetes diagnosis. Will order insulin starter kit and RN to review insulin administration.  Will follow daily. Thank you. Rhonda Vaughan, RD, LDN, CDE Inpatient Diabetes Coordinator [phone]    

## 2014-06-10 NOTE — Progress Notes (Signed)
INITIAL NUTRITION ASSESSMENT  DOCUMENTATION CODES Per approved criteria  -Not Applicable   INTERVENTION: - Glucerna shakes BID - RD to continue to monitor   NUTRITION DIAGNOSIS: Altered nutrition related laboratory values related to DKA as evidenced by MD notes.   Goal: 1. CBGs WNL 2. Pt to consume >90% of meals/supplements  Monitor:  Weights, labs, intake   Reason for Assessment: Malnutrition screening tool   58 y.o. female  Admitting Dx: DKA, type 2  ASSESSMENT: Pt with remote history of diabetes, diet controlled, presented to [facility] ED with main concern of several days duration of progressively worsening productive cough of clear sputum, fevers as high as 100 F, chills, malaise, poor oral intake. This has progressed and she reports several episodes of non bloody vomiting over the past 24 hours prior to this admission. Pt reports she takes no medications and was at one point on diabetic meds but has not taken any over the past year.   - Found to have diabetic ketoacidosis, sepsis, and PNA - Pt alone and asleep in room - Observed pt had a few bites of jello from tray - Per diabetic coordinator notes, pt lost 70 pounds in the past year  - No current HbA1c available    Height: Ht Readings from Last 1 Encounters:  06/09/14  (1.6 m)    Weight: Wt Readings from Last 1 Encounters:  06/10/14 109 lb 2 oz (49.5 kg)    Ideal Body Weight: 115 lbs   % Ideal Body Weight: 95%  Wt Readings from Last 10 Encounters:  06/10/14 109 lb 2 oz (49.5 kg)    Usual Body Weight: 179 lbs   % Usual Body Weight: 61%  BMI:  Body mass index is 19.34 kg/(m^2).  Estimated Nutritional Needs: Kcal: 1300-1500 Protein: 60-80g Fluid: 1.3-1.5L/day   Skin: intact   Diet Order:  Heart healthy/CHO modified   EDUCATION NEEDS: -No education needs identified at this time   Intake/Output Summary (Last 24 hours) at 06/10/14 1353 Last data filed at 06/10/14 1200  Gross per 24 hour   Intake 2311.65 ml  Output    400 ml  Net 1911.65 ml    Last BM: 9/21  Labs:   Recent Labs Lab 06/09/14 1030  06/09/14 2056 06/10/14 0051 06/10/14 0256  NA  --   < > 134* 136* 133*  K  --   < > 3.9 3.9 3.5*  CL  --   < > 104 104 101  CO2  --   < > 18* 19 20  BUN  --   < > 26* 23 21  CREATININE  --   < > 0.70 0.67 0.63  CALCIUM  --   < > 8.9 9.0 9.1  MG 2.3  --   --   --   --   PHOS 7.5*  --   --   --   --   GLUCOSE  --   < > 140* 121* 154*  < > = values in this interval not displayed.  CBG (last 3)   Recent Labs  06/10/14 0826 06/10/14 0928 06/10/14 1025  GLUCAP 159* 220* 178*    Scheduled Meds: . amLODipine  5 mg Oral Daily  . azithromycin  500 mg Intravenous Q24H  . cefTRIAXone (ROCEPHIN)  IV  1 g Intravenous Q24H  . enoxaparin (LOVENOX) injection  40 mg Subcutaneous Q24H    Continuous Infusions: . sodium chloride Stopped (06/09/14 1719)  . sodium chloride 75 mL/hr at 06/10/14  1215  . dextrose 5 % and 0.45% NaCl 1,000 mL (06/09/14 1719)  . insulin (NOVOLIN-R) infusion Stopped (06/10/14 1027)    Past Medical History  Diagnosis Date  . Diabetes mellitus without complication   . Hypertension     Past Surgical History  Procedure Laterality Date  . Abdominal hysterectomy    . Fracture surgery      Charlott Rakes MS, RD, LDN [phone] Pager [phone] Weekend/After Hours Pager

## 2014-06-10 NOTE — Progress Notes (Signed)
Patient ID: Nicole Yates, female   DOB: July 06, 1956, 58 y.o.   MRN: [id number] [facility] PROGRESS NOTE  HENRETTA QUIST PIR:[id number] DOB: 1955-11-13 DOA: 06/09/2014 PCP: No primary provider on file.  Brief narrative: 58 year old female with remote history of diabetes, diet controlled who presented to [facility] ED 06/09/2014 with progressively worsening cough productive of clear sputum, fevers, chills and malaise. Patient also reported poor oral intake and has had several episodes of nonbloody vomiting over past 24 hours prior to this admission. On admission, patient was noted to be slightly hypothermic withT 96.8 F, tachycardic with HR 109, with CBG > 600, initial BMP notable for CO2 < 7, Na 129, WBC 18.7, Hg 16.3. Chest x-ray did not show acute cardiopulmonary findings. Patient was admitted to step down unit for management of diabetic ketoacidosis. Patient was started on azithromycin and Rocephin for treatment of possible pneumonia.  Assessment/Plan:    Principal Problem:  DKA / Diabetes mellitus type 2 likely uncontrolled   Pt admitted to SDU for management of DKA. Initial blood work showed CBG more than 600, CO2 less than 7, AG 22; blood glucose on BMP more than 700, lactic acid 3.74, moderate ketones, on pH acidemia and on UA ketone more than 80.   DKA protocol initiated. Continue insulin drip. Continue IV fluids, CBG every 1 hour and BMP every 2 hours until AG closes or CO2 normalizes.  Appreciate diabetic coordinator consult  Check A1c Active Problems: Sepsis   Sepsis criteria met with initial vitals that included hypothermia, tachycardia, leukocytosis, elevated lactic acid   Likely source pneumonia based on clinical presentation.  Started azithromycin and rocephin.  Follow up blood culture results, resp culture results, urine culture results. Community acquired pneumonia / Leukocytosis  Based on clinical symptoms but not evident of CXR   Will treat with Zithro and Rocephin as  noted above   Follow up sputum culture, urine legionella and strep penumo   Oxygen support vai nasal canula to keep O2 saturation above 90% HTN, accelerated   Start Norvasc 5 mg daily and add hydralazine 10 mg IV every 6 hours PRN BP above 140/90 Hemoconcentration, Hg 17.4   From dehydration   Improved with IV fluids  Acute pancreatitis   Likely from DKA, pt currently denies abd pain   Lipase level on admission 216 but subsequently normalized  Severe PCM   Possibly from uncontrolled DM   Pt cachetic  Nutrition consulted  Hyponatremia   From hyperglycemia   Continue IV fluids, sodium ranges 133 - 136   DVT Prophylaxis   Lovenox subQ  Code Status: Full.  Family Communication:  plan of care discussed with the patient Disposition Plan: Home when stable. We will keep patient in step down unit. Nurse will notify once patient off of insulin drip at which time we will put the order to transfer patient to floor.   IV Access:   Peripheral IV Procedures and diagnostic studies:    Dg Chest Port 1 View  06/09/2014   No active disease.   Electronically Signed   By: Kathreen Devoid   On: 06/09/2014 10:13  Medical Consultants:   None  Other Consultants:   Diabetic coordinator Anti-Infectives:   Azithromycin 06/09/2014 --> Rocephin 06/09/2014 -->   Leisa Lenz, MD  [facility] Pager [phone]  If 7PM-7AM, please contact night-coverage [email or web address] Password [email or web address] 06/10/2014, 6:06 AM   LOS: 1 day    HPI/Subjective: No acute overnight events.  Objective: Filed Vitals:   06/09/14  2200 06/09/14 2300 06/10/14 0000 06/10/14 0400  BP: 143/47 170/51 164/56 176/50  Pulse: 89 87 88 85  Temp:   98.5 F (36.9 C) 98.3 F (36.8 C)  TempSrc:   Oral Oral  Resp: $Remo'24 22 16 17  'JhNKp$ Height:      Weight:    49.5 kg (109 lb 2 oz)  SpO2: 95% 95% 92% 96%    Intake/Output Summary (Last 24 hours) at 06/10/14 0606 Last data filed at 06/10/14 0500  Gross per 24 hour  Intake  1432.65 ml  Output    400 ml  Net 1032.65 ml    Exam:   General:  Pt is alert, follows commands appropriately, not in acute distress  Cardiovascular: Regular rate and rhythm, S1/S2 appreciated  Respiratory: Coarse breath sounds, no wheezing  Abdomen: Soft, non tender, non distended, bowel sounds present  Extremities: No edema, pulses DP and PT palpable bilaterally  Neuro: Grossly nonfocal  Data Reviewed: Basic Metabolic Panel:  Recent Labs Lab 06/09/14 1030  06/09/14 1504 06/09/14 1920 06/09/14 2056 06/10/14 0051 06/10/14 0256  NA  --   < > 135* 134* 134* 136* 133*  K  --   < > 4.1 4.0 3.9 3.9 3.5*  CL  --   < > 98 101 104 104 101  CO2  --   --  10* 19 18* 19 20  GLUCOSE  --   < > 341* 169* 140* 121* 154*  BUN  --   < > 33* 28* 26* 23 21  CREATININE  --   < > 0.83 0.76 0.70 0.67 0.63  CALCIUM  --   --  8.6 9.0 8.9 9.0 9.1  MG 2.3  --   --   --   --   --   --   PHOS 7.5*  --   --   --   --   --   --   < > = values in this interval not displayed. Liver Function Tests:  Recent Labs Lab 06/09/14 1029  AST 15  ALT 18  ALKPHOS 162*  BILITOT 0.3  PROT 7.4  ALBUMIN 3.7    Recent Labs Lab 06/09/14 1030 06/10/14 0256  LIPASE 216* 27   No results found for this basename: AMMONIA,  in the last 168 hours CBC:  Recent Labs Lab 06/09/14 0901 06/09/14 1107 06/09/14 1504 06/10/14 0256  WBC 18.4*  --  18.7* 18.7*  HGB 17.4* 16.3* 15.0 13.5  HCT 50.7* 48.0* 41.0 37.3  MCV 88.8  --  82.7 81.1  PLT 287  --  168 243   Cardiac Enzymes: No results found for this basename: CKTOTAL, CKMB, CKMBINDEX, TROPONINI,  in the last 168 hours BNP: No components found with this basename: POCBNP,  CBG:  Recent Labs Lab 06/09/14 2057 06/09/14 2218 06/10/14 0005 06/10/14 0132 06/10/14 0209  GLUCAP 153* 117* 110* 135* 138*    MRSA PCR SCREENING     Status: None   Collection Time    06/09/14  2:02 PM      Result Value Ref Range Status   MRSA by PCR NEGATIVE   NEGATIVE Final     Scheduled Meds: . azithromycin  500 mg Intravenous Q24H  . cefTRIAXone (ROCEPHIN)  IV  1 g Intravenous Q24H  . enoxaparin (LOVENOX) injection  40 mg Subcutaneous Q24H   Continuous Infusions: . sodium chloride Stopped (06/09/14 1719)  . dextrose 5 % and 0.45% NaCl 1,000 mL (06/09/14 1719)  . insulin (NOVOLIN-R) infusion  1.4 Units/hr (06/10/14 0500)

## 2014-06-11 LAB — GLUCOSE, CAPILLARY
GLUCOSE-CAPILLARY: 211 mg/dL — AB (ref 70–99)
Glucose-Capillary: 109 mg/dL — ABNORMAL HIGH (ref 70–99)
Glucose-Capillary: 176 mg/dL — ABNORMAL HIGH (ref 70–99)
Glucose-Capillary: 248 mg/dL — ABNORMAL HIGH (ref 70–99)
Glucose-Capillary: 265 mg/dL — ABNORMAL HIGH (ref 70–99)

## 2014-06-11 LAB — LEGIONELLA ANTIGEN, URINE: LEGIONELLA ANTIGEN, URINE: NEGATIVE

## 2014-06-11 MED ORDER — INSULIN GLARGINE 100 UNIT/ML ~~LOC~~ SOLN
10.0000 [IU] | Freq: Every day | SUBCUTANEOUS | Status: DC
  Administered 2014-06-11 – 2014-06-12 (×2): 10 [IU] via SUBCUTANEOUS
  Filled 2014-06-11 (×2): qty 0.1

## 2014-06-11 NOTE — Progress Notes (Signed)
Patient self administered subcutaneous insulin and states " I know how to do this, I already give my self insulin shots at home."

## 2014-06-11 NOTE — Progress Notes (Signed)
Inpatient Diabetes Program Recommendations  AACE/ADA: New Consensus Statement on Inpatient Glycemic Control (2013)  Target Ranges:  Prepandial:   less than 140 mg/dL      Peak postprandial:   less than 180 mg/dL (1-2 hours)      Critically ill patients:  140 - 180 mg/dL   Reason for Visit: Hyperglycemia  Results for LAZARA, GRIESER (MRN [id number]) as of 06/11/2014 11:51  Ref. Range 06/11/2014 04:29 06/11/2014 08:06 06/11/2014 11:38  Glucose-Capillary Latest Range: 70-99 mg/dL 211 (H) 176 (H) 265 (H)  Results for NAYAB, ATEN (MRN [id number]) as of 06/11/2014 11:51  Ref. Range 06/10/2014 02:56  Hemoglobin A1C Latest Range: <5.7 % 19.1 (H)   HgbA1C of 19.1% reveals very poor glycemic control at home. Needs insulin at discharge. Will need PCP to manage DM - case manager consult for PCP OP Diabetes Education consult for uncontrolled DM Living Well With Diabetes book Insulin Starter Kit - RN to begin teaching insulin administration. Will discuss HgbA1C results with pt this afternoon.   Thank you. Lorenda Peck, RD, LDN, CDE Inpatient Diabetes Coordinator [phone]     Note:

## 2014-06-11 NOTE — Progress Notes (Signed)
Pt self administered SQ insulin, pt states "she is comfortable doing it."

## 2014-06-11 NOTE — Progress Notes (Signed)
PROGRESS NOTE  Nicole Yates XLK:[id number] DOB: 1956-02-09 DOA: 06/09/2014 PCP: No primary provider on file.  HPI: 58 year old female with remote history of diabetes, diet controlled who presented to [facility] ED 06/09/2014 with progressively worsening cough productive of clear sputum, fevers, chills and malaise. Patient also reported poor oral intake and has had several episodes of nonbloody vomiting over past 24 hours prior to this admission. On admission, patient was noted to be slightly hypothermic withT 96.8 F, tachycardic with HR 109, with CBG > 600, initial BMP notable for CO2 < 7, Na 129, WBC 18.7, Hg 16.3. Chest x-ray did not show acute cardiopulmonary findings. Patient was admitted to step down unit for management of diabetic ketoacidosis. Patient was started on azithromycin and Rocephin for treatment of possible pneumonia.  Subjective/ 24 H Interval events - no overnight events, feeling better this morning - able to eat  Assessment/Plan:  DKA / Diabetes mellitus type 2 likely uncontrolled  Pt admitted to SDU for management of DKA. Initial blood work showed CBG more than 600, CO2 less than 7, AG 22; blood glucose on BMP more than 700, lactic acid 3.74, moderate ketones, on pH acidemia and on UA ketone more than 80.  DKA protocol initiated, able to come off drip, and Lantus 10 U daily started Appreciate diabetic coordinator consult  A1c 19 Sepsis  Sepsis criteria met with initial vitals that included hypothermia, tachycardia, leukocytosis, elevated lactic acid  Likely source pneumonia based on clinical presentation.  Started azithromycin and rocephin.  Follow up blood culture results, resp culture results, urine culture results. Community acquired pneumonia / Leukocytosis  Based on clinical symptoms but not evident of CXR  Will treat with Zithro and Rocephin as noted above  Follow up sputum culture, urine legionella and strep penumo  Oxygen support vai nasal canula to keep O2  saturation above 90% HTN, accelerated  Start Norvasc 5 mg daily and add hydralazine 10 mg IV every 6 hours PRN BP above 140/90 Hemoconcentration, Hg 17.4  From dehydration  Improved with IV fluids  Acute pancreatitis  Likely from DKA, pt currently denies abd pain  Lipase level on admission 216 but subsequently normalized  Severe PCM  Possibly from uncontrolled DM  Pt cachetic  Nutrition consulted  Hyponatremia  From hyperglycemia  Continue IV fluids, sodium ranges 133 - 136  Diet: carb modified/heart healthy Fluids: NS at 75 cc/h DVT Prophylaxis: Lovenox  Code Status: Full Family Communication: d/w patient  Disposition Plan: home when ready   Consultants:  None   Procedures:  None   Antibiotics  Anti-infectives   Start     Dose/Rate Route Frequency Ordered Stop   06/10/14 1000  cefTRIAXone (ROCEPHIN) 1 g in dextrose 5 % 50 mL IVPB     1 g 100 mL/hr over 30 Minutes Intravenous Every 24 hours 06/09/14 1930 06/17/14 0959   06/10/14 1000  azithromycin (ZITHROMAX) 500 mg in dextrose 5 % 250 mL IVPB     500 mg 250 mL/hr over 60 Minutes Intravenous Every 24 hours 06/09/14 1930 06/17/14 0959   06/09/14 1000  cefTRIAXone (ROCEPHIN) 1 g in dextrose 5 % 50 mL IVPB     1 g 100 mL/hr over 30 Minutes Intravenous  Once 06/09/14 0959 06/09/14 1205   06/09/14 1000  azithromycin (ZITHROMAX) 500 mg in dextrose 5 % 250 mL IVPB     500 mg 250 mL/hr over 60 Minutes Intravenous  Once 06/09/14 0959 06/09/14 1245     Antibiotics Given (  last 72 hours)   Date/Time Action Medication Dose Rate   06/10/14 0930 Given   cefTRIAXone (ROCEPHIN) 1 g in dextrose 5 % 50 mL IVPB 1 g 100 mL/hr   06/10/14 1023 Given   azithromycin (ZITHROMAX) 500 mg in dextrose 5 % 250 mL IVPB 500 mg 250 mL/hr   06/11/14 0957 Given   cefTRIAXone (ROCEPHIN) 1 g in dextrose 5 % 50 mL IVPB 1 g 100 mL/hr   06/11/14 0957 Given   azithromycin (ZITHROMAX) 500 mg in dextrose 5 % 250 mL IVPB 500 mg 250 mL/hr       Studies  Filed Vitals:   06/10/14 2024 06/11/14 0001 06/11/14 0431 06/11/14 1309  BP: 142/80  144/63 140/57  Pulse: 102  90 103  Temp: 98.4 F (36.9 C) 98.9 F (37.2 C) 98.2 F (36.8 C) 98.5 F (36.9 C)  TempSrc: Oral Oral Oral Oral  Resp: $Remo'18  16 18  'VhwLE$ Height:      Weight:      SpO2: 96%  96% 97%    Intake/Output Summary (Last 24 hours) at 06/11/14 1419 Last data filed at 06/11/14 1300  Gross per 24 hour  Intake   1930 ml  Output      0 ml  Net   1930 ml   Filed Weights   06/09/14 1414 06/10/14 0400  Weight: 46.8 kg (103 lb 2.8 oz) 49.5 kg (109 lb 2 oz)    Exam:  General:  NAD  Cardiovascular: RRR  Respiratory: CTA biL  Abdomen: soft, non tender  MSK: no edema  Data Reviewed: Basic Metabolic Panel:  Recent Labs Lab 06/09/14 1030  06/09/14 1504 06/09/14 1920 06/09/14 2056 06/10/14 0051 06/10/14 0256  NA  --   < > 135* 134* 134* 136* 133*  K  --   < > 4.1 4.0 3.9 3.9 3.5*  CL  --   < > 98 101 104 104 101  CO2  --   --  10* 19 18* 19 20  GLUCOSE  --   < > 341* 169* 140* 121* 154*  BUN  --   < > 33* 28* 26* 23 21  CREATININE  --   < > 0.83 0.76 0.70 0.67 0.63  CALCIUM  --   --  8.6 9.0 8.9 9.0 9.1  MG 2.3  --   --   --   --   --   --   PHOS 7.5*  --   --   --   --   --   --   < > = values in this interval not displayed. Liver Function Tests:  Recent Labs Lab 06/09/14 1029  AST 15  ALT 18  ALKPHOS 162*  BILITOT 0.3  PROT 7.4  ALBUMIN 3.7    Recent Labs Lab 06/09/14 1030 06/10/14 0256  LIPASE 216* 27   No results found for this basename: AMMONIA,  in the last 168 hours CBC:  Recent Labs Lab 06/09/14 0901 06/09/14 1107 06/09/14 1504 06/10/14 0256  WBC 18.4*  --  18.7* 18.7*  HGB 17.4* 16.3* 15.0 13.5  HCT 50.7* 48.0* 41.0 37.3  MCV 88.8  --  82.7 81.1  PLT 287  --  168 243   Cardiac Enzymes: No results found for this basename: CKTOTAL, CKMB, CKMBINDEX, TROPONINI,  in the last 168 hours BNP (last 3 results) No results  found for this basename: PROBNP,  in the last 8760 hours CBG:  Recent Labs Lab 06/10/14 2030 06/11/14 06/11/14  0429 06/11/14 0806 06/11/14 1138  GLUCAP 265* 109* 211* 176* 265*    Recent Results (from the past 240 hour(s))  CULTURE, BLOOD (ROUTINE X 2)     Status: None   Collection Time    06/09/14 11:02 AM      Result Value Ref Range Status   Specimen Description BLOOD RIGHT ANTECUBITAL   Final   Special Requests BOTTLES DRAWN AEROBIC AND ANAEROBIC 5ML   Final   Culture  Setup Time     Final   Value: 06/09/2014 14:30     Performed at [facility]   Culture     Final   Value:        BLOOD CULTURE RECEIVED NO GROWTH TO DATE CULTURE WILL BE HELD FOR 5 DAYS BEFORE ISSUING A FINAL NEGATIVE REPORT     Performed at [facility]   Report Status PENDING   Incomplete  MRSA PCR SCREENING     Status: None   Collection Time    06/09/14  2:02 PM      Result Value Ref Range Status   MRSA by PCR NEGATIVE  NEGATIVE Final   Comment:            The GeneXpert MRSA Assay (FDA     approved for NASAL specimens     only), is one component of a     comprehensive MRSA colonization     surveillance program. It is not     intended to diagnose MRSA     infection nor to guide or     monitor treatment for     MRSA infections.  CULTURE, EXPECTORATED SPUTUM-ASSESSMENT     Status: None   Collection Time    06/10/14  8:44 AM      Result Value Ref Range Status   Specimen Description SPUTUM   Final   Special Requests NONE   Final   Sputum evaluation     Final   Value: THIS SPECIMEN IS ACCEPTABLE. RESPIRATORY CULTURE REPORT TO FOLLOW.   Report Status 06/10/2014 FINAL   Final  CULTURE, RESPIRATORY (NON-EXPECTORATED)     Status: None   Collection Time    06/10/14  8:44 AM      Result Value Ref Range Status   Specimen Description SPUTUM   Final   Special Requests NONE   Final   Gram Stain     Final   Value: RARE WBC PRESENT,BOTH PMN AND MONONUCLEAR     RARE SQUAMOUS EPITHELIAL CELLS  PRESENT     NO ORGANISMS SEEN     Performed at [facility]   Culture     Final   Value: NORMAL OROPHARYNGEAL FLORA     Performed at [facility]   Report Status PENDING   Incomplete     Studies: No results found.  Scheduled Meds: . amLODipine  5 mg Oral Daily  . azithromycin  500 mg Intravenous Q24H  . cefTRIAXone (ROCEPHIN)  IV  1 g Intravenous Q24H  . enoxaparin (LOVENOX) injection  40 mg Subcutaneous Q24H  . feeding supplement (GLUCERNA SHAKE)  237 mL Oral BID BM  . insulin aspart  0-15 Units Subcutaneous 6 times per day  . insulin glargine  10 Units Subcutaneous Daily   Continuous Infusions: . sodium chloride 75 mL/hr at 06/11/14 0019  . insulin (NOVOLIN-R) infusion Stopped (06/10/14 1027)    Principal Problem:   DKA, type 2 Active Problems:   Hyperglycemia   Leukocytosis, unspecified  Hypokalemia   CAP (community acquired pneumonia)   Time spent: [place], MD [facility] Pager [phone]. If 7 PM - 7 AM, please contact night-coverage at [email or web address], password [facility] 06/11/2014, 2:19 PM  LOS: 2 days

## 2014-06-12 LAB — BASIC METABOLIC PANEL
Anion gap: 14 (ref 5–15)
BUN: 9 mg/dL (ref 6–23)
CALCIUM: 9 mg/dL (ref 8.4–10.5)
CO2: 24 meq/L (ref 19–32)
Chloride: 102 mEq/L (ref 96–112)
Creatinine, Ser: 0.57 mg/dL (ref 0.50–1.10)
GFR calc Af Amer: >90 mL/min (ref >90)
GFR calc non Af Amer: >90 mL/min (ref >90)
GLUCOSE: 148 mg/dL — AB (ref 70–99)
Potassium: 3.1 mEq/L — ABNORMAL LOW (ref 3.7–5.3)
Sodium: 140 mEq/L (ref 137–147)

## 2014-06-12 LAB — GLUCOSE, CAPILLARY
GLUCOSE-CAPILLARY: 284 mg/dL — AB (ref 70–99)
Glucose-Capillary: 135 mg/dL — ABNORMAL HIGH (ref 70–99)
Glucose-Capillary: 154 mg/dL — ABNORMAL HIGH (ref 70–99)
Glucose-Capillary: 213 mg/dL — ABNORMAL HIGH (ref 70–99)
Glucose-Capillary: 307 mg/dL — ABNORMAL HIGH (ref 70–99)

## 2014-06-12 LAB — CBC
HEMATOCRIT: 39.7 % (ref 36.0–46.0)
Hemoglobin: 14 g/dL (ref 12.0–15.0)
MCH: 29.5 pg (ref 26.0–34.0)
MCHC: 35.3 g/dL (ref 30.0–36.0)
MCV: 83.6 fL (ref 78.0–100.0)
Platelets: 252 10*3/uL (ref 150–400)
RBC: 4.75 MIL/uL (ref 3.87–5.11)
RDW: 13.3 % (ref 11.5–15.5)
WBC: 9.2 10*3/uL (ref 4.0–10.5)

## 2014-06-12 LAB — CULTURE, RESPIRATORY: Culture: NORMAL

## 2014-06-12 LAB — CULTURE, RESPIRATORY W GRAM STAIN

## 2014-06-12 MED ORDER — AMLODIPINE BESYLATE 5 MG PO TABS: 5.0000 mg | ORAL_TABLET | Freq: Every day | ORAL | Status: DC

## 2014-06-12 MED ORDER — "INSULIN SYRINGE-NEEDLE U-100 30G X 3/8"" 0.3 ML MISC": 1.0000 | Freq: Four times a day (QID) | Status: DC

## 2014-06-12 MED ORDER — INSULIN ASPART 100 UNIT/ML ~~LOC~~ SOLN: 3.0000 [IU] | Freq: Three times a day (TID) | SUBCUTANEOUS | Status: DC

## 2014-06-12 MED ORDER — PROMETHAZINE HCL 12.5 MG PO TABS: 12.5000 mg | ORAL_TABLET | Freq: Four times a day (QID) | ORAL | Status: DC | PRN

## 2014-06-12 MED ORDER — METFORMIN HCL 500 MG PO TABS: 500.0000 mg | ORAL_TABLET | Freq: Two times a day (BID) | ORAL | Status: DC

## 2014-06-12 MED ORDER — ALBUTEROL SULFATE HFA 108 (90 BASE) MCG/ACT IN AERS: 2.0000 | INHALATION_SPRAY | Freq: Four times a day (QID) | RESPIRATORY_TRACT | Status: DC | PRN

## 2014-06-12 MED ORDER — INSULIN GLARGINE 100 UNIT/ML ~~LOC~~ SOLN: 10.0000 [IU] | Freq: Every day | SUBCUTANEOUS | Status: DC

## 2014-06-12 MED ORDER — POTASSIUM CHLORIDE CRYS ER 20 MEQ PO TBCR
40.0000 meq | EXTENDED_RELEASE_TABLET | ORAL | Status: AC
  Administered 2014-06-12 (×2): 40 meq via ORAL
  Filled 2014-06-12 (×2): qty 2

## 2014-06-12 MED ORDER — LEVOFLOXACIN 500 MG PO TABS: 500.0000 mg | ORAL_TABLET | Freq: Every day | ORAL | Status: DC

## 2014-06-12 NOTE — Care Management Note (Signed)
    Page 1 of 1   06/12/2014     1:52:16 PM CARE MANAGEMENT NOTE 06/12/2014  Patient:  Nicole Yates, Nicole Yates   Account Number:  [id number]  Date Initiated:  06/12/2014  Documentation initiated by:  Lanier Clam  Subjective/Objective Assessment:   58 Y/O F ADMITTED W/DKA.     Action/Plan:   FROM HOME.   Anticipated DC Date:  06/12/2014   Anticipated DC Plan:  HOME/SELF CARE      DC Planning Services  [facility]      Choice offered to / List presented to:             Status of service:  Completed, signed off Medicare Important Message given?   (If response is "NO", the following Medicare IM given date fields will be blank) Date Medicare IM given:   Medicare IM given by:   Date Additional Medicare IM given:   Additional Medicare IM given by:    Discharge Disposition:  HOME/SELF CARE  Per UR Regulation:  Reviewed for med. necessity/level of care/duration of stay  If discussed at Long Length of Stay Meetings, dates discussed:    Comments:  06/12/14 [facility] RN,BSN [phone] [phone] PROVIED W/INSURANCE INFO.PCP-[facility]-SPOKE TO [facility]-WHO SAID NO APPTS AVAILABLE-PATIENT MUST CALL TOMORROW TO SET UP APPT.PATIENT VOICED UNDERSTANDING.[facility]-PHARMACY-ABLE TO PROVIDE MEDS FOR A 1X FREE ASSISTANCE.PATIENT INFORMED TO GO DIRECTLY TO [facility] PHARMACY-PATIENT VOICED UNDERSTANDING.PROVIDED PATIENT W/DIRECTIONS TO [facility] PHARM[facility]Y.NO FURTHER D/C NEEDS.

## 2014-06-12 NOTE — Progress Notes (Signed)
D/C instructions reviewed w/ pt and husband, both verbalize understanding, all questions answered. Pt d/c in stable conditon in w/c to husband's car by NT. Pt in possession of d/c instructions, scripts, and all personal belongings.

## 2014-06-12 NOTE — Discharge Summary (Signed)
Physician Discharge Summary  Nicole Yates ZLD:[id number] DOB: 04/10/1956 DOA: 06/09/2014  PCP: No primary provider on file.  Admit date: 06/09/2014 Discharge date: 06/12/2014  Time spent: 35 minutes  Recommendations for Outpatient Follow-up:  1. Follow up with [facility] today. (Go directly to [facility] pharmacy today @ d/c to get your medicines  Recommendations for primary care physician for things to follow:  Monitor sugar log and further adjust insulin regimen  Discharge Diagnoses:  Principal Problem:   DKA, type 2 Active Problems:   Hyperglycemia   Leukocytosis, unspecified   Hypokalemia   CAP (community acquired pneumonia)  Discharge Condition: stable  Diet recommendation: diabetic   Filed Weights   06/09/14 1414 06/10/14 0400  Weight: 46.8 kg (103 lb 2.8 oz) 49.5 kg (109 lb 2 oz)   History of present illness:  Pt is 58 yo female with remote history of diabetes, diet controlled, presented to [facility] ED with main concern of several days duration of progressively worsening productive cough of clear sputum, fevers as high as 100 F, chills, malaise, poor oral intake. This has progressed and she reports several episodes of non bloody vomiting over the past 24 hours prior to this admission. Pt reports she takes no medications and was at one point on diabetic meds but has not taken any over the past year. She denies chest pain, shortness of breath, no other specific abd or urinary concerns. In Ed, pt noted to be slightly hypothermic with T 96.8 F, tachycardic with HR 109, with CBG > 600, initial BMP notable for CO2 < 7, Na 129, WBC 18.7, Hg 16.3, along with other metabolic derangements noted below. TRH asked to admit for further evaluation.  Hospital Course:  DKA / Diabetes mellitus type 2 likely uncontrolled  Pt admitted to SDU for management of DKA. Initial blood work showed CBG more than 600, CO2 less than 7, AG 22; blood glucose on BMP more than 700,  lactic acid 3.74, moderate ketones, on pH acidemia and on UA ketone more than 80.  Her DKA resolved and patient was on Lantus 10 U daily and Novolog 3 U TID with meals with reasonable glucose control. She was also started on metformin. She will go to [facility] the day of discharge to get her medications, appreciate CM help in assistance with outpatient follow up Appreciate diabetic coordinator consult  A1c 19 showing poor control Patient extensively counseled about CBG checks and maintaining a sugar log, teaching about insulin administration provided by the nursing staff Sepsis  Sepsis criteria met with initial vitals that included hypothermia, tachycardia, leukocytosis, elevated lactic acid  Likely source pneumonia based on clinical presentation.  Started azithromycin and rocephin, respiratory status improved with antibiotics, she was on room air on discharge and her antibiotics were narrowed to levofloxacin and she is to complete a course as an outpatient.  Community acquired pneumonia / Leukocytosis  Based on clinical symptoms but not evident of CXR  Will treat with antibiotics as noted above HTN, accelerated  Start Norvasc 5 mg daily Hemoconcentration, Hg 17.4  From dehydration  Improved with IV fluids  Acute pancreatitis / abdominal pain  Likely from DKA, pt currently denies abd pain  Lipase level on admission 216 but subsequently normalized  Her abdominal pain and nausea may well be due to a component of gastroparesis. Severe PCM  Possibly from uncontrolled DM  Pt cachetic  Nutrition consulted  Hyponatremia  From hyperglycemia  Continue IV  fluids, sodium ranges 133 - 136    Procedures:  none   Consultations:  none  Discharge Exam: Filed Vitals:   06/11/14 2046 06/12/14 0513 06/12/14 1044 06/12/14 1217  BP: 123/73 134/72 132/68 114/77  Pulse: 97 88  86  Temp: 99.1 F (37.3 C) 98.8 F (37.1 C)  98 F (36.7 C)  TempSrc: Oral Oral  Oral    Resp: _0 Height:      Weight:      SpO2: 97% 97%  96%    General: NAD Cardiovascular: RRR Respiratory: CTA biL  Discharge Instructions     Medication List         albuterol 108 (90 BASE) MCG/ACT inhaler  Commonly known as:  PROVENTIL HFA;VENTOLIN HFA  Inhale 2 puffs into the lungs every 6 (six) hours as needed for wheezing or shortness of breath.     amLODipine 5 MG tablet  Commonly known as:  NORVASC  Take 1 tablet (5 mg total) by mouth daily.     ibuprofen 200 MG tablet  Commonly known as:  ADVIL,MOTRIN  Take 400 mg by mouth every 6 (six) hours as needed for fever or moderate pain.     insulin aspart 100 UNIT/ML injection  Commonly known as:  novoLOG  Inject 3 Units into the skin 3 (three) times daily with meals.     insulin glargine 100 UNIT/ML injection  Commonly known as:  LANTUS  Inject 0.1 mLs (10 Units total) into the skin daily.     Insulin Syringe-Needle U-100 30G X 3/8" 0.3 ML Misc  1 Syringe by Does not apply route 4 (four) times daily.     levofloxacin 500 MG tablet  Commonly known as:  LEVAQUIN  Take 1 tablet (500 mg total) by mouth daily.     metFORMIN 500 MG tablet  Commonly known as:  GLUCOPHAGE  Take 1 tablet (500 mg total) by mouth 2 (two) times daily with a meal.     multivitamin with minerals Tabs tablet  Take 1 tablet by mouth daily.     promethazine 12.5 MG tablet  Commonly known as:  PHENERGAN  Take 1 tablet (12.5 mg total) by mouth every 6 (six) hours as needed for nausea or vomiting.           Follow-up Information   Call [facility]    . (call tomorrow 06/13/14 to make appt for pcp/bring in phot id/meds in bottle/$20 copay)    Contact information:   [address] [place] [address] [phone]      Follow up with [facility]    . (Go directly to [facility] pharmacy today @ d/c to get your medicines.)    Contact information:   [place] [phone]     The results of significant diagnostics from this hospitalization (including imaging, microbiology, ancillary and laboratory) are listed below for reference.    Significant Diagnostic Studies: Dg Chest Port 1 View  06/09/2014   CLINICAL DATA:  Nausea, vomiting, cough  EXAM: PORTABLE CHEST - 1 VIEW  COMPARISON:  11/27/2007  FINDINGS: The lungs are hyperinflated likely secondary to COPD. There is no focal parenchymal opacity, pleural effusion, or pneumothorax. The heart and mediastinal contours are unremarkable.  The osseous structures are unremarkable.  IMPRESSION: No active disease.   Electronically Signed   By: Kathreen Devoid   On: 06/09/2014 10:13    Microbiology:  Recent Results (from the past 240 hour(s))  CULTURE, BLOOD (ROUTINE X 2)     Status: None   Collection Time    06/09/14 11:02 AM      Result Value Ref Range Status   Specimen Description BLOOD RIGHT ANTECUBITAL   Final   Special Requests BOTTLES DRAWN AEROBIC AND ANAEROBIC 5ML   Final   Culture  Setup Time     Final   Value: 06/09/2014 14:30     Performed at [facility]   Culture     Final   Value:        BLOOD CULTURE RECEIVED NO GROWTH TO DATE CULTURE WILL BE HELD FOR 5 DAYS BEFORE ISSUING A FINAL NEGATIVE REPORT     Performed at [facility]   Report Status PENDING   Incomplete  MRSA PCR SCREENING     Status: None   Collection Time    06/09/14  2:02 PM      Result Value Ref Range Status   MRSA by PCR NEGATIVE  NEGATIVE Final   Comment:            The GeneXpert MRSA Assay (FDA     approved for NASAL specimens     only), is one component of a     comprehensive MRSA colonization     surveillance program. It is not     intended to diagnose MRSA     infection nor to guide or     monitor treatment for     MRSA infections.  CULTURE, EXPECTORATED SPUTUM-ASSESSMENT     Status: None   Collection Time    06/10/14  8:44 AM      Result Value Ref Range Status    Specimen Description SPUTUM   Final   Special Requests NONE   Final   Sputum evaluation     Final   Value: THIS SPECIMEN IS ACCEPTABLE. RESPIRATORY CULTURE REPORT TO FOLLOW.   Report Status 06/10/2014 FINAL   Final  CULTURE, RESPIRATORY (NON-EXPECTORATED)     Status: None   Collection Time    06/10/14  8:44 AM      Result Value Ref Range Status   Specimen Description SPUTUM   Final   Special Requests NONE   Final   Gram Stain     Final   Value: RARE WBC PRESENT,BOTH PMN AND MONONUCLEAR     RARE SQUAMOUS EPITHELIAL CELLS PRESENT     NO ORGANISMS SEEN     Performed at [facility]   Culture     Final   Value: NORMAL OROPHARYNGEAL FLORA     Performed at [facility]   Report Status 06/12/2014 FINAL   Final     Labs: Basic Metabolic Panel:  Recent Labs Lab 06/09/14 1030  06/09/14 1920 06/09/14 2056 06/10/14 0051 06/10/14 0256 06/12/14 0500  NA  --   < > 134* 134* 136* 133* 140  K  --   < > 4.0 3.9 3.9 3.5* 3.1*  CL  --   < > 101 104 104 101 102  CO2  --   < > 19 18* _0 GLUCOSE  --   < > 169* 140* 121* 154* 148*  BUN  --   < > 28* 26* _1 CREATININE  --   < > 0.76 0.70 0.67 0.63 0.57  CALCIUM  --   < > 9.0 8.9 9.0 9.1 9.0  MG 2.3  --   --   --   --   --   --  PHOS 7.5*  --   --   --   --   --   --   < > = values in this interval not displayed. Liver Function Tests:  Recent Labs Lab 06/09/14 1029  AST 15  ALT 18  ALKPHOS 162*  BILITOT 0.3  PROT 7.4  ALBUMIN 3.7    Recent Labs Lab 06/09/14 1030 06/10/14 0256  LIPASE 216* 27   CBC:  Recent Labs Lab 06/09/14 0901 06/09/14 1107 06/09/14 1504 06/10/14 0256 06/12/14 0500  WBC 18.4*  --  18.7* 18.7* 9.2  HGB 17.4* 16.3* 15.0 13.5 14.0  HCT 50.7* 48.0* 41.0 37.3 39.7  MCV 88.8  --  82.7 81.1 83.6  PLT 287  --  168 243 252   CBG:  Recent Labs Lab 06/11/14 2043 06/12/14 0037 06/12/14 0504 06/12/14 0759 06/12/14 1131  GLUCAP 284* 135* 154* 213* 307*    Signed:  Jaylena Holloway  [facility] 06/12/2014, 6:15 PM

## 2014-06-12 NOTE — Discharge Instructions (Signed)
Accuchecks 4 times/day, Once in AM empty stomach and then before each meal. Log in all results and show them to your Prim.MD in 1 week If any glucose reading is under 80 or above 300 call your Prim MD immediately.   You were cared for by a hospitalist during your hospital stay. If you have any questions about your discharge medications or the care you received while you were in the hospital after you are discharged, you can call the unit and asked to speak with the hospitalist on call if the hospitalist that took care of you is not available. Once you are discharged, your primary care physician will handle any further medical issues. Please note that NO REFILLS for any discharge medications will be authorized once you are discharged, as it is imperative that you return to your primary care physician (or establish a relationship with a primary care physician if you do not have one) for your aftercare needs so that they can reassess your need for medications and monitor your lab values.     If you do not have a primary care physician, you can call [phone] for a physician referral.  Follow with Primary MD in 1-2 weeks  Get CBC, CMP checked by your doctor and again as further instructed.  Get a 2 view Chest X ray done next visit if you had Pneumonia of Lung problems at the Hospital.  Get Medicines reviewed and adjusted.  Please request your Prim.MD to go over all Hospital Tests and Procedure/Radiological results at the follow up, please get all Hospital records sent to your Prim MD by signing hospital release before you go home.  Activity: As tolerated with Full fall precautions use walker/cane & assistance as needed  Diet: diabetic  For Heart failure patients - Check your Weight same time everyday, if you gain over 2 pounds, or you develop in leg swelling, experience more shortness of breath or chest pain, call your Primary MD immediately. Follow Cardiac Low Salt Diet and 1.8 lit/day fluid  restriction.  Disposition Home  If you experience worsening of your admission symptoms, develop shortness of breath, life threatening emergency, suicidal or homicidal thoughts you must seek medical attention immediately by calling 911 or calling your MD immediately  if symptoms less severe.  You Must read complete instructions/literature along with all the possible adverse reactions/side effects for all the Medicines you take and that have been prescribed to you. Take any new Medicines after you have completely understood and accpet all the possible adverse reactions/side effects.   Do not drive and provide baby sitting services if your were admitted for syncope or siezures until you have seen by Primary MD or a Neurologist and advised to do so again.  Do not drive when taking Pain medications.   Do not take more than prescribed Pain, Sleep and Anxiety Medications  Special Instructions: If you have smoked or chewed Tobacco  in the last 2 yrs please stop smoking, stop any regular Alcohol  and or any Recreational drug use.  Wear Seat belts while driving.  Blood Glucose Monitoring Monitoring your blood glucose (also know as blood sugar) helps you to manage your diabetes. It also helps you and your health care provider monitor your diabetes and determine how well your treatment plan is working. WHY SHOULD YOU MONITOR YOUR BLOOD GLUCOSE?  It can help you understand how food, exercise, and medicine affect your blood glucose.  It allows you to know what your blood glucose is at any  given moment. You can quickly tell if you are having low blood glucose (hypoglycemia) or high blood glucose (hyperglycemia).  It can help you and your health care provider know how to adjust your medicines.  It can help you understand how to manage an illness or adjust medicine for exercise. WHEN SHOULD YOU TEST? Your health care provider will help you decide how often you should check your blood glucose. This may  depend on the type of diabetes you have, your diabetes control, or the types of medicines you are taking. Be sure to write down all of your blood glucose readings so that this information can be reviewed with your health care provider. See below for examples of testing times that your health care provider may suggest. Type 1 Diabetes  Test 4 times a day if you are in good control, using an insulin pump, or perform multiple daily injections.  If your diabetes is not well controlled or if you are sick, you may need to monitor more often.  It is a good idea to also monitor:  Before and after exercise.  Between meals and 2 hours after a meal.  Occasionally between 2:00 a.m. and 3:00 a.m. Type 2 Diabetes  It can vary with each person, but generally, if you are on insulin, test 4 times a day.  If you take medicines by mouth (orally), test 2 times a day.  If you are on a controlled diet, test once a day.  If your diabetes is not well controlled or if you are sick, you may need to monitor more often. HOW TO MONITOR YOUR BLOOD GLUCOSE Supplies Needed  Blood glucose meter.  Test strips for your meter. Each meter has its own strips. You must use the strips that go with your own meter.  A pricking needle (lancet).  A device that holds the lancet (lancing device).  A journal or log book to write down your results. Procedure  Wash your hands with soap and water. Alcohol is not preferred.  Prick the side of your finger (not the tip) with the lancet.  Gently milk the finger until a small drop of blood appears.  Follow the instructions that come with your meter for inserting the test strip, applying blood to the strip, and using your blood glucose meter. Other Areas to Get Blood for Testing Some meters allow you to use other areas of your body (other than your finger) to test your blood. These areas are called alternative sites. The most common alternative sites are:  The  forearm.  The thigh.  The back area of the lower leg.  The palm of the hand. The blood flow in these areas is slower. Therefore, the blood glucose values you get may be delayed, and the numbers are different from what you would get from your fingers. Do not use alternative sites if you think you are having hypoglycemia. Your reading will not be accurate. Always use a finger if you are having hypoglycemia. Also, if you cannot feel your lows (hypoglycemia unawareness), always use your fingers for your blood glucose checks. ADDITIONAL TIPS FOR GLUCOSE MONITORING  Do not reuse lancets.  Always carry your supplies with you.  All blood glucose meters have a 24-hour "hotline" number to call if you have questions or need help.  Adjust (calibrate) your blood glucose meter with a control solution after finishing a few boxes of strips. BLOOD GLUCOSE RECORD KEEPING It is a good idea to keep a daily record or log  of your blood glucose readings. Most glucose meters, if not all, keep your glucose records stored in the meter. Some meters come with the ability to download your records to your home computer. Keeping a record of your blood glucose readings is especially helpful if you are wanting to look for patterns. Make notes to go along with the blood glucose readings because you might forget what happened at that exact time. Keeping good records helps you and your health care provider to work together to achieve good diabetes management.  Document Released: 09/08/2003 Document Revised: 01/20/2014 Document Reviewed: 01/28/2013 [facility] Patient Information 2015 [place], [place]. This information is not intended to replace advice given to you by your health care provider. Make sure you discuss any questions you have with your health care provider.

## 2014-06-15 LAB — CULTURE, BLOOD (ROUTINE X 2): CULTURE: NO GROWTH

## 2014-07-03 ENCOUNTER — Encounter: Payer: Self-pay | Admitting: Internal Medicine

## 2014-07-03 ENCOUNTER — Ambulatory Visit: Payer: Self-pay | Attending: Internal Medicine | Admitting: Internal Medicine

## 2014-07-03 VITALS — BP 120/67 | HR 78 | Temp 98.2°F | Resp 18 | Ht 63.5 in | Wt 118.0 lb

## 2014-07-03 DIAGNOSIS — Z87891 Personal history of nicotine dependence: Secondary | ICD-10-CM | POA: Insufficient documentation

## 2014-07-03 DIAGNOSIS — IMO0002 Reserved for concepts with insufficient information to code with codable children: Secondary | ICD-10-CM | POA: Insufficient documentation

## 2014-07-03 DIAGNOSIS — Z794 Long term (current) use of insulin: Secondary | ICD-10-CM | POA: Insufficient documentation

## 2014-07-03 DIAGNOSIS — I10 Essential (primary) hypertension: Secondary | ICD-10-CM | POA: Insufficient documentation

## 2014-07-03 DIAGNOSIS — Z23 Encounter for immunization: Secondary | ICD-10-CM | POA: Insufficient documentation

## 2014-07-03 DIAGNOSIS — E1165 Type 2 diabetes mellitus with hyperglycemia: Secondary | ICD-10-CM | POA: Insufficient documentation

## 2014-07-03 LAB — GLUCOSE, POCT (MANUAL RESULT ENTRY): POC GLUCOSE: 138 mg/dL — AB (ref 70–99)

## 2014-07-03 NOTE — Progress Notes (Signed)
Patient presents to establish care for T2DM and HTN HFU for pneumonia Need med refills

## 2014-07-03 NOTE — Progress Notes (Signed)
Patient ID: Nicole Yates, female   DOB: 04-06-1956, 58 y.o.   MRN: [id number]  CC: HFU  HPI:  Patient presents to clinic today for a hospital follow up.  She has a past medical history of T2DM, hypertension, and anxiety.  Patient reports that last year her hemoglobin a1c was 5.0 and she thought she could maintain her DM with diet and lifestyle.  Patient reports that she went without medication for a total of one year.  She reports that at that time she was hospitalized for pneumonia and DKA with a hemoglobin a1c of 19.1.  Since hopsital discharge she has been checking her blood sugar six times per day and her log reveals 84 to 219.  Her higher readings are at bedtime.  She injects 10 units of lantus each morning and uses novolog 3 uints after each meal.    Her fasting and postprandial blood sugars are WNL. Up to date on colonoscopy.     Allergies  Allergen Reactions  . Codeine Other (See Comments)    Childhood allergy, couldn't walk, talk  . Tetanus Toxoids Swelling   Past Medical History  Diagnosis Date  . Diabetes mellitus without complication   . Hypertension   . Anxiety    Current Outpatient Prescriptions on File Prior to Visit  Medication Sig Dispense Refill  . albuterol (PROVENTIL HFA;VENTOLIN HFA) 108 (90 BASE) MCG/ACT inhaler Inhale 2 puffs into the lungs every 6 (six) hours as needed for wheezing or shortness of breath.  1 Inhaler  2  . amLODipine (NORVASC) 5 MG tablet Take 1 tablet (5 mg total) by mouth daily.  30 tablet  1  . ibuprofen (ADVIL,MOTRIN) 200 MG tablet Take 400 mg by mouth every 6 (six) hours as needed for fever or moderate pain.      Marland Kitchen. insulin aspart (NOVOLOG) 100 UNIT/ML injection Inject 3 Units into the skin 3 (three) times daily with meals.  10 mL  11  . insulin glargine (LANTUS) 100 UNIT/ML injection Inject 0.1 mLs (10 Units total) into the skin daily.  10 mL  11  . Insulin Syringe-Needle U-100 30G X 3/8" 0.3 ML MISC 1 Syringe by Does not apply route 4 (four)  times daily.  120 each  1  . metFORMIN (GLUCOPHAGE) 500 MG tablet Take 1 tablet (500 mg total) by mouth 2 (two) times daily with a meal.  60 tablet  1  . Multiple Vitamin (MULTIVITAMIN WITH MINERALS) TABS tablet Take 1 tablet by mouth daily.      . promethazine (PHENERGAN) 12.5 MG tablet Take 1 tablet (12.5 mg total) by mouth every 6 (six) hours as needed for nausea or vomiting.  30 tablet  0  . levofloxacin (LEVAQUIN) 500 MG tablet Take 1 tablet (500 mg total) by mouth daily.  4 tablet  0   No current facility-administered medications on file prior to visit.   Family History  Problem Relation Age of Onset  . Cancer Mother 2371    breast   History   Social History  . Marital Status: Married    Spouse Name: N/A    Number of Children: N/A  . Years of Education: N/A   Occupational History  . Not on file.   Social History Main Topics  . Smoking status: Former Games developermoker  . Smokeless tobacco: Never Used  . Alcohol Use: Yes     Comment: social  . Drug Use: Not on file  . Sexual Activity: No   Other Topics Concern  .  Not on file   Social History Narrative  . No narrative on file    Review of Systems  Constitutional: Negative.   HENT: Negative.   Eyes: Positive for blurred vision (cataracts).  Respiratory: Positive for shortness of breath (improved since discharge).   Cardiovascular: Negative.   Gastrointestinal: Positive for heartburn and abdominal pain (anxiety). Negative for nausea, vomiting, diarrhea and constipation.       Hernia   Genitourinary: Negative.   Musculoskeletal: Negative.   Skin: Negative.   Neurological: Positive for tingling (improved since hopsital discharge). Negative for dizziness.  Psychiatric/Behavioral: The patient is nervous/anxious.       Objective:   Filed Vitals:   07/03/14 1522  BP: 120/67  Pulse: 78  Temp: 98.2 F (36.8 C)  Resp: 18    Physical Exam: Constitutional: Patient appears well-developed and well-nourished. No  distress. HENT: Normocephalic, atraumatic, External right and left ear normal. Oropharynx is clear and moist.  Eyes: Conjunctivae and EOM are normal. PERRLA, no scleral icterus. Neck: Normal ROM. Neck supple. No JVD. No tracheal deviation. No thyromegaly. CVS: RRR, S1/S2 +, no murmurs, no gallops, no carotid bruit.  Pulmonary: Effort and breath sounds normal, no stridor, rhonchi, wheezes, rales.  Abdominal: Soft. BS +,  no distension, tenderness, rebound or guarding.  Musculoskeletal: Normal range of motion. No edema and no tenderness.  Lymphadenopathy: No lymphadenopathy noted, cervical Neuro: Alert. Normal reflexes, muscle tone coordination. No cranial nerve deficit. Skin: Skin is warm and dry. No rash noted. Not diaphoretic. No erythema. No pallor. Psychiatric: Normal mood and affect. Behavior, judgment, thought content normal.  Lab Results  Component Value Date   WBC 9.2 06/12/2014   HGB 14.0 06/12/2014   HCT 39.7 06/12/2014   MCV 83.6 06/12/2014   PLT 252 06/12/2014   Lab Results  Component Value Date   CREATININE 0.57 06/12/2014   BUN 9 06/12/2014   NA 140 06/12/2014   K 3.1* 06/12/2014   CL 102 06/12/2014   CO2 24 06/12/2014    Lab Results  Component Value Date   HGBA1C 19.1* 06/10/2014   Lipid Panel     Component Value Date/Time   CHOL 178 01/20/2010 2110   TRIG 66 01/20/2010 2110   HDL 64 01/20/2010 2110   CHOLHDL 2.8 Ratio 01/20/2010 2110   VLDL 13 01/20/2010 2110   LDLCALC 101* 01/20/2010 2110       Assessment and plan:   Nicole DandyMary was seen today for establish care, diabetes and hospitalization follow-up.  Diagnoses and associated orders for this visit:  DM (diabetes mellitus), type 2, uncontrolled - Glucose (CBG) - Lipid panel; Future - Microalbumin, urine - Ambulatory referral to Podiatry - MM Digital Screening; Future  Need for prophylactic vaccination against Streptococcus pneumoniae (pneumococcus) - Pneumococcal polysaccharide vaccine 23-valent greater than or equal to  2yo subcutaneous/IM   Return in about 1 day (around 07/04/2014) for Lab Visit and 3 mo PCP.       Holland CommonsKECK, VALERIE, NP-C [facility] [phone] 07/15/2014, 1:49 PM

## 2014-07-04 ENCOUNTER — Ambulatory Visit: Payer: Self-pay | Attending: Internal Medicine

## 2014-07-04 VITALS — BP 122/86 | HR 70 | Temp 98.0°F | Resp 16

## 2014-07-04 DIAGNOSIS — E1165 Type 2 diabetes mellitus with hyperglycemia: Secondary | ICD-10-CM | POA: Insufficient documentation

## 2014-07-04 LAB — MICROALBUMIN, URINE: Microalb, Ur: 0.4 mg/dL (ref <2.0)

## 2014-07-04 LAB — LIPID PANEL
CHOLESTEROL: 235 mg/dL — AB (ref 0–200)
HDL: 89 mg/dL (ref >39)
LDL CALC: 132 mg/dL — AB (ref 0–99)
Total CHOL/HDL Ratio: 2.6 Ratio
Triglycerides: 69 mg/dL (ref <150)
VLDL: 14 mg/dL (ref 0–40)

## 2014-07-04 NOTE — Progress Notes (Unsigned)
Patient is here to have her fasting blood work drawn

## 2014-07-09 ENCOUNTER — Telehealth: Payer: Self-pay | Admitting: Emergency Medicine

## 2014-07-09 MED ORDER — ATORVASTATIN CALCIUM 20 MG PO TABS: 20.0000 mg | ORAL_TABLET | Freq: Every day | ORAL | Status: DC

## 2014-07-09 NOTE — Telephone Encounter (Signed)
Message copied by Darlis LoanSMITH, Dayanne Yiu D on Wed Jul 09, 2014  1:48 PM ------      Message from: Holland CommonsKECK, VALERIE A      Created: Tue Jul 08, 2014  2:03 PM       Patient's cholesterol is elevated, please send prescription for atorvastatin 20 mg to take daily at dinnertime.  Please educate patient on specific diet changes and exercise. ------

## 2014-07-09 NOTE — Telephone Encounter (Signed)
Pt given lab results with instructions to start taking prescribed Atorvastatin 20 mg tab daily with monitoring foods saturated in fat/oils/butters and exercise. [facility]

## 2014-08-06 ENCOUNTER — Other Ambulatory Visit: Payer: Self-pay | Admitting: Emergency Medicine

## 2014-08-06 MED ORDER — AMLODIPINE BESYLATE 5 MG PO TABS: 5.0000 mg | ORAL_TABLET | Freq: Every day | ORAL | Status: DC

## 2014-08-06 MED ORDER — METFORMIN HCL 500 MG PO TABS: 500.0000 mg | ORAL_TABLET | Freq: Two times a day (BID) | ORAL | Status: DC

## 2014-08-26 ENCOUNTER — Encounter: Payer: Self-pay | Admitting: Internal Medicine

## 2014-08-26 ENCOUNTER — Ambulatory Visit: Payer: Self-pay | Attending: Internal Medicine | Admitting: Internal Medicine

## 2014-08-26 VITALS — BP 143/86 | HR 99 | Temp 98.5°F | Resp 16 | Ht 63.5 in | Wt 111.0 lb

## 2014-08-26 DIAGNOSIS — E119 Type 2 diabetes mellitus without complications: Secondary | ICD-10-CM | POA: Insufficient documentation

## 2014-08-26 DIAGNOSIS — Z794 Long term (current) use of insulin: Secondary | ICD-10-CM | POA: Insufficient documentation

## 2014-08-26 DIAGNOSIS — E785 Hyperlipidemia, unspecified: Secondary | ICD-10-CM | POA: Insufficient documentation

## 2014-08-26 DIAGNOSIS — M19041 Primary osteoarthritis, right hand: Secondary | ICD-10-CM | POA: Insufficient documentation

## 2014-08-26 DIAGNOSIS — M1990 Unspecified osteoarthritis, unspecified site: Secondary | ICD-10-CM | POA: Insufficient documentation

## 2014-08-26 DIAGNOSIS — I10 Essential (primary) hypertension: Secondary | ICD-10-CM | POA: Insufficient documentation

## 2014-08-26 DIAGNOSIS — M19042 Primary osteoarthritis, left hand: Secondary | ICD-10-CM | POA: Insufficient documentation

## 2014-08-26 LAB — POCT GLYCOSYLATED HEMOGLOBIN (HGB A1C): Hemoglobin A1C: 7.2

## 2014-08-26 LAB — GLUCOSE, POCT (MANUAL RESULT ENTRY): POC Glucose: 115 mg/dl — AB (ref 70–99)

## 2014-08-26 MED ORDER — ATORVASTATIN CALCIUM 20 MG PO TABS: 20.0000 mg | ORAL_TABLET | Freq: Every day | ORAL | Status: DC

## 2014-08-26 MED ORDER — DICLOFENAC POTASSIUM 50 MG PO TABS: 50.0000 mg | ORAL_TABLET | Freq: Three times a day (TID) | ORAL | Status: DC

## 2014-08-26 MED ORDER — AMLODIPINE BESYLATE 5 MG PO TABS: 5.0000 mg | ORAL_TABLET | Freq: Every day | ORAL | Status: DC

## 2014-08-26 MED ORDER — TRAMADOL HCL 50 MG PO TABS: 50.0000 mg | ORAL_TABLET | Freq: Three times a day (TID) | ORAL | Status: DC | PRN

## 2014-08-26 MED ORDER — METFORMIN HCL 500 MG PO TABS: 500.0000 mg | ORAL_TABLET | Freq: Two times a day (BID) | ORAL | Status: DC

## 2014-08-26 MED ORDER — INSULIN GLARGINE 100 UNIT/ML ~~LOC~~ SOLN: 10.0000 [IU] | Freq: Every day | SUBCUTANEOUS | Status: DC

## 2014-08-26 MED ORDER — INSULIN ASPART 100 UNIT/ML ~~LOC~~ SOLN: 3.0000 [IU] | Freq: Three times a day (TID) | SUBCUTANEOUS | Status: DC

## 2014-08-26 NOTE — Progress Notes (Signed)
Patient ID: Nicole FurlongMary K Yates, female   DOB: 11-13-1955, 58 y.o.   MRN: [id number]  SUBJECTIVE: 58 y.o. female for follow up of diabetes. Diabetic Review of Systems - medication compliance: compliant all of the time, diabetic diet compliance: compliant most of the time, home glucose monitoring: is performed regularly, values are usually less than 180.  Other symptoms and concerns: are arthritis pain in bilateral hands and back.  Patient reports that for the past two weeks she has been having pain that affects her sleep at night.  It is described as a aching pain with morning stiffness.  She currently rates pain as a 6/10.  She has tried heating pads and advil for pain relief.  She notes that she is recovering from bilateral cataract removal that took place three weeks ago for the right eye and yesterday for the left eye.  She notes significant vision improvement.    Current Outpatient Prescriptions  Medication Sig Dispense Refill  . albuterol (PROVENTIL HFA;VENTOLIN HFA) 108 (90 BASE) MCG/ACT inhaler Inhale 2 puffs into the lungs every 6 (six) hours as needed for wheezing or shortness of breath. 1 Inhaler 2  . amLODipine (NORVASC) 5 MG tablet Take 1 tablet (5 mg total) by mouth daily. 30 tablet 3  . atorvastatin (LIPITOR) 20 MG tablet Take 1 tablet (20 mg total) by mouth daily. 90 tablet 3  . insulin aspart (NOVOLOG) 100 UNIT/ML injection Inject 3 Units into the skin 3 (three) times daily with meals. 10 mL 11  . insulin glargine (LANTUS) 100 UNIT/ML injection Inject 0.1 mLs (10 Units total) into the skin daily. 10 mL 11  . Insulin Syringe-Needle U-100 30G X 3/8" 0.3 ML MISC 1 Syringe by Does not apply route 4 (four) times daily. 120 each 1  . levofloxacin (LEVAQUIN) 500 MG tablet Take 1 tablet (500 mg total) by mouth daily. 4 tablet 0  . metFORMIN (GLUCOPHAGE) 500 MG tablet Take 1 tablet (500 mg total) by mouth 2 (two) times daily with a meal. 60 tablet 3  . Multiple Vitamin (MULTIVITAMIN WITH MINERALS)  TABS tablet Take 1 tablet by mouth daily.    . promethazine (PHENERGAN) 12.5 MG tablet Take 1 tablet (12.5 mg total) by mouth every 6 (six) hours as needed for nausea or vomiting. 30 tablet 0  . diclofenac (CATAFLAM) 50 MG tablet Take 1 tablet (50 mg total) by mouth 3 (three) times daily. 60 tablet 3  . traMADol (ULTRAM) 50 MG tablet Take 1 tablet (50 mg total) by mouth every 8 (eight) hours as needed. 60 tablet 0   No current facility-administered medications for this visit.    OBJECTIVE: Appearance: alert, well appearing, and in no distress, oriented to person, place, and time and acyanotic, in no respiratory distress. BP 143/86 mmHg  Pulse 99  Temp(Src) 98.5 F (36.9 C) (Oral)  Resp 16  Ht 5' 3.5" (1.613 m)  Wt 111 lb (50.349 kg)  BMI 19.35 kg/m2  SpO2 99%  Exam: heart sounds normal rate, regular rhythm, normal S1, S2, no murmurs, rubs, clicks or gallops, chest clear, no carotid bruits, feet: warm, good capillary refill, normal DP and PT pulses and normal monofilament exam  ASSESSMENT: Diabetes Mellitus: well controlled and improved  PLAN: See orders for this visit as documented in the electronic medical record. Issues reviewed with her: diabetic diet discussed in detail, written exchange diet given, foot care discussed and Podiatry visits discussed, long term diabetic complications discussed and for patient to increase food intake.  Assessment and plan:   Nicole Yates was seen today for follow-up.  Diagnoses and associated orders for this visit:  Type 2 diabetes mellitus without complication - Glucose (CBG) - HgB A1c - Ambulatory referral to Podiatry - metFORMIN (GLUCOPHAGE) 500 MG tablet; Take 1 tablet (500 mg total) by mouth 2 (two) times daily with a meal. - Refill insulin glargine (LANTUS) 100 UNIT/ML injection; Inject 0.1 mLs (10 Units total) into the skin daily. - Refill insulin aspart (NOVOLOG) 100 UNIT/ML injection; Inject 3 Units into the skin 3 (three) times daily  with meals.  Essential hypertension - Refill amLODipine (NORVASC) 5 MG tablet; Take 1 tablet (5 mg total) by mouth daily. Patient blood pressure is stable and may continue on current medication.  Education on diet, exercise, and modifiable risk factors discussed. Will obtain appropriate labs as needed. Will follow up in 3-6 months.   HLD (hyperlipidemia) - Continue atorvastatin (LIPITOR) 20 MG tablet; Take 1 tablet (20 mg total) by mouth daily.  Arthritis - diclofenac (CATAFLAM) 50 MG tablet; Take 1 tablet (50 mg total) by mouth 3 (three) times daily. - traMADol (ULTRAM) 50 MG tablet; Take 1 tablet (50 mg total) by mouth every 8 (eight) hours as needed. Will try diclofenac, if no improvement may try Celebrex  Return in about 3 months (around 11/25/2014) for DM/HTN.        Holland CommonsKECK, Anoushka Divito, NP-C [facility] [phone] 08/26/2014, 3:55 PM

## 2014-08-26 NOTE — Progress Notes (Signed)
Pt is here following up on her diabetes. Pt states that she is starting to show signs of arthritis.  Pt reports that she is having some dizzy spells.

## 2014-09-04 ENCOUNTER — Other Ambulatory Visit: Payer: Self-pay | Admitting: Internal Medicine

## 2014-09-04 DIAGNOSIS — E119 Type 2 diabetes mellitus without complications: Secondary | ICD-10-CM

## 2014-09-04 MED ORDER — INSULIN GLARGINE 100 UNIT/ML ~~LOC~~ SOLN: 10.0000 [IU] | Freq: Every day | SUBCUTANEOUS | Status: DC

## 2014-09-04 MED ORDER — INSULIN ASPART 100 UNIT/ML ~~LOC~~ SOLN: 3.0000 [IU] | Freq: Three times a day (TID) | SUBCUTANEOUS | Status: DC

## 2014-10-06 ENCOUNTER — Other Ambulatory Visit: Payer: Self-pay | Admitting: Internal Medicine

## 2014-11-12 ENCOUNTER — Encounter: Payer: Self-pay | Admitting: Internal Medicine

## 2014-11-12 ENCOUNTER — Ambulatory Visit: Payer: Self-pay | Attending: Internal Medicine | Admitting: Internal Medicine

## 2014-11-12 VITALS — BP 139/73 | HR 83 | Temp 98.7°F | Resp 16 | Ht 63.5 in | Wt 112.0 lb

## 2014-11-12 DIAGNOSIS — R110 Nausea: Secondary | ICD-10-CM | POA: Insufficient documentation

## 2014-11-12 DIAGNOSIS — Z87891 Personal history of nicotine dependence: Secondary | ICD-10-CM | POA: Insufficient documentation

## 2014-11-12 DIAGNOSIS — F419 Anxiety disorder, unspecified: Secondary | ICD-10-CM | POA: Insufficient documentation

## 2014-11-12 DIAGNOSIS — E119 Type 2 diabetes mellitus without complications: Secondary | ICD-10-CM | POA: Insufficient documentation

## 2014-11-12 DIAGNOSIS — Z79899 Other long term (current) drug therapy: Secondary | ICD-10-CM | POA: Insufficient documentation

## 2014-11-12 DIAGNOSIS — M1990 Unspecified osteoarthritis, unspecified site: Secondary | ICD-10-CM | POA: Insufficient documentation

## 2014-11-12 DIAGNOSIS — Z887 Allergy status to serum and vaccine status: Secondary | ICD-10-CM | POA: Insufficient documentation

## 2014-11-12 DIAGNOSIS — Z885 Allergy status to narcotic agent status: Secondary | ICD-10-CM | POA: Insufficient documentation

## 2014-11-12 DIAGNOSIS — I10 Essential (primary) hypertension: Secondary | ICD-10-CM | POA: Insufficient documentation

## 2014-11-12 DIAGNOSIS — Z794 Long term (current) use of insulin: Secondary | ICD-10-CM | POA: Insufficient documentation

## 2014-11-12 LAB — GLUCOSE, POCT (MANUAL RESULT ENTRY): POC Glucose: 79 mg/dl (ref 70–99)

## 2014-11-12 MED ORDER — CELECOXIB 100 MG PO CAPS: 100.0000 mg | ORAL_CAPSULE | Freq: Every day | ORAL | Status: DC

## 2014-11-12 MED ORDER — TRAMADOL HCL 50 MG PO TABS
50.0000 mg | ORAL_TABLET | Freq: Three times a day (TID) | ORAL | Status: DC | PRN
Start: 2014-11-12 — End: 2015-03-16

## 2014-11-12 MED ORDER — METFORMIN HCL ER 500 MG PO TB24: 500.0000 mg | ORAL_TABLET | Freq: Every day | ORAL | Status: DC

## 2014-11-12 MED ORDER — PROMETHAZINE HCL 12.5 MG PO TABS
12.5000 mg | ORAL_TABLET | Freq: Four times a day (QID) | ORAL | Status: DC | PRN
Start: 2014-11-12 — End: 2016-12-28

## 2014-11-12 NOTE — Progress Notes (Signed)
Pt is here following up on her HTN and diabetes. Pt states that she has occasional abdomen pain. Pt stopped taking her diclofenac b/c she suspected that it was the cause of her stomach pain.

## 2014-11-12 NOTE — Patient Instructions (Signed)
Celecoxib capsules  What is this medicine?  CELECOXIB (sell a KOX ib) is a non-steroidal anti-inflammatory drug (NSAID). This medicine is used to treat arthritis and ankylosing spondylitis. It may be also used for pain or painful monthly periods.  This medicine may be used for other purposes; ask your health care provider or pharmacist if you have questions.  COMMON BRAND NAME(S): Celebrex  What should I tell my health care provider before I take this medicine?  They need to know if you have any of these conditions:  -asthma  -coronary artery bypass graft (CABG) surgery within the past 2 weeks  -drink more than 3 alcohol-containing drinks a day  -heart disease or circulation problems like heart failure or leg edema (fluid retention)  -high blood pressure  -kidney disease  -liver disease  -stomach bleeding or ulcers  -an unusual or allergic reaction to celecoxib, sulfa drugs, aspirin, other NSAIDs, other medicines, foods, dyes, or preservatives  -pregnant or trying to get pregnant  -breast-feeding  How should I use this medicine?  Take this medicine by mouth with a full glass of water. Follow the directions on the prescription label. Take it with food if it upsets your stomach or if you take 400 mg at one time. Try to not lie down for at least 10 minutes after you take the medicine. Take the medicine at the same time each day. Do not take more medicine than you are told to take. Long-term, continuous use may increase the risk of heart attack or stroke.  A special MedGuide will be given to you by the pharmacist with each prescription and refill. Be sure to read this information carefully each time.  Talk to your pediatrician regarding the use of this medicine in children. Special care may be needed.  Overdosage: If you think you have taken too much of this medicine contact a poison control center or emergency room at once.  NOTE: This medicine is only for you. Do not share this medicine with others.  What if I miss a  dose?  If you miss a dose, take it as soon as you can. If it is almost time for your next dose, take only that dose. Do not take double or extra doses.  What may interact with this medicine?  Do not take this medicine with any of the following medications:  -cidofovir  -methotrexate  -other NSAIDs, medicines for pain and inflammation, like ibuprofen or naproxen  -pemetrexed  This medicine may also interact with the following medications:  -alcohol  -aspirin and aspirin-like drugs  -diuretics  -fluconazole  -lithium  -medicines for high blood pressure  -steroid medicines like prednisone or cortisone  -warfarin  This list may not describe all possible interactions. Give your health care provider a list of all the medicines, herbs, non-prescription drugs, or dietary supplements you use. Also tell them if you smoke, drink alcohol, or use illegal drugs. Some items may interact with your medicine.  What should I watch for while using this medicine?  Tell your doctor or health care professional if your pain does not get better. Talk to your doctor before taking another medicine for pain. Do not treat yourself.  This medicine does not prevent heart attack or stroke. In fact, this medicine may increase the chance of a heart attack or stroke. The chance may increase with longer use of this medicine and in people who have heart disease. If you take aspirin to prevent heart attack or stroke, talk with your   doctor or health care professional.  Do not take medicines such as ibuprofen and naproxen with this medicine. Side effects such as stomach upset, nausea, or ulcers may be more likely to occur. Many medicines available without a prescription should not be taken with this medicine.  This medicine can cause ulcers and bleeding in the stomach and intestines at any time during treatment. Ulcers and bleeding can happen without warning symptoms and can cause death.  What side effects may I notice from receiving this medicine?  Side  effects that you should report to your doctor or health care professional as soon as possible:  -allergic reactions like skin rash, itching or hives, swelling of the face, lips, or tongue  -black or bloody stools, blood in the urine or vomit  -blurred vision  -breathing problems  -chest pain  -nausea, vomiting  -problems with balance, talking, walking  -redness, blistering, peeling or loosening of the skin, including inside the mouth  -unexplained weight gain or swelling  -unusually weak or tired  -yellowing of eyes, skin  Side effects that usually do not require medical attention (report to your doctor or health care professional if they continue or are bothersome):  -constipation or diarrhea  -dizziness  -gas or heartburn  -upset stomach  This list may not describe all possible side effects. Call your doctor for medical advice about side effects. You may report side effects to FDA at 1-800-FDA-1088.  Where should I keep my medicine?  Keep out of the reach of children.  Store at room temperature between 15 and 30 degrees C (59 and 86 degrees F). Keep container tightly closed. Throw away any unused medicine after the expiration date.  NOTE: This sheet is a summary. It may not cover all possible information. If you have questions about this medicine, talk to your doctor, pharmacist, or health care provider.  © 2015, [employer]. (2009-11-04 10:54:17)

## 2014-11-12 NOTE — Progress Notes (Signed)
Patient ID: Nicole FurlongMary K Yates, female   DOB: [birth year], 59 y.o.   MRN: [id number]  CC: f/u   HPI: Nicole CastleMary Yates is a 59 y.o. female here today for a follow up visit.  Patient has past medical history of T2DM, HTN, and anxiety.  She is here to follow up on her arthritis. She states that she has been having stomach problems with the diclofenac. She denies dark tarry stool. She has stopped taking the diclofenac and has only been using tramadol. Patient reports some GI upset with metformin use as well.  Patient has No headache, No chest pain,  No new weakness tingling or numbness, No Cough - SOB.  Allergies  Allergen Reactions  . Codeine Other (See Comments)    Childhood allergy, couldn't walk, talk  . Tetanus Toxoids Swelling   Past Medical History  Diagnosis Date  . Diabetes mellitus without complication   . Hypertension   . Anxiety    Current Outpatient Prescriptions on File Prior to Visit  Medication Sig Dispense Refill  . albuterol (PROVENTIL HFA;VENTOLIN HFA) 108 (90 BASE) MCG/ACT inhaler Inhale 2 puffs into the lungs every 6 (six) hours as needed for wheezing or shortness of breath. 1 Inhaler 2  . amLODipine (NORVASC) 5 MG tablet Take 1 tablet (5 mg total) by mouth daily. 30 tablet 3  . atorvastatin (LIPITOR) 20 MG tablet Take 1 tablet (20 mg total) by mouth daily. 90 tablet 3  . insulin aspart (NOVOLOG) 100 UNIT/ML injection Inject 3 Units into the skin 3 (three) times daily with meals. 30 mL 3  . insulin glargine (LANTUS) 100 UNIT/ML injection Inject 0.1 mLs (10 Units total) into the skin daily. 15 mL 3  . Insulin Syringe-Needle U-100 30G X 3/8" 0.3 ML MISC 1 Syringe by Does not apply route 4 (four) times daily. 120 each 1  . metFORMIN (GLUCOPHAGE) 500 MG tablet Take 1 tablet (500 mg total) by mouth 2 (two) times daily with a meal. 60 tablet 3  . Multiple Vitamin (MULTIVITAMIN WITH MINERALS) TABS tablet Take 1 tablet by mouth daily.    . traMADol (ULTRAM) 50 MG tablet Take 1 tablet (50  mg total) by mouth every 8 (eight) hours as needed. 60 tablet 0  . diclofenac (CATAFLAM) 50 MG tablet Take 1 tablet (50 mg total) by mouth 3 (three) times daily. (Patient not taking: Reported on 11/12/2014) 60 tablet 3  . levofloxacin (LEVAQUIN) 500 MG tablet Take 1 tablet (500 mg total) by mouth daily. (Patient not taking: Reported on 11/12/2014) 4 tablet 0  . promethazine (PHENERGAN) 12.5 MG tablet Take 1 tablet (12.5 mg total) by mouth every 6 (six) hours as needed for nausea or vomiting. (Patient not taking: Reported on 11/12/2014) 30 tablet 0   No current facility-administered medications on file prior to visit.   Family History  Problem Relation Age of Onset  . Cancer Mother [id number]    breast   History   Social History  . Marital Status: Married    Spouse Name: N/A  . Number of Children: N/A  . Years of Education: N/A   Occupational History  . Not on file.   Social History Main Topics  . Smoking status: Former Games developermoker  . Smokeless tobacco: Never Used  . Alcohol Use: Yes     Comment: social  . Drug Use: Not on file  . Sexual Activity: No   Other Topics Concern  . Not on file   Social History Narrative    Review of  Systems  Gastrointestinal: Positive for nausea, vomiting and abdominal pain.  Musculoskeletal:       Arthralgia      Objective:   Filed Vitals:   11/12/14 1539  BP: 139/73  Pulse: 83  Temp: 98.7 F (37.1 C)  Resp: 16    Physical Exam: Constitutional: Patient appears well-developed and well-nourished. No distress. HENT: Normocephalic, atraumatic, External right and left ear normal. Oropharynx is clear and moist.  Eyes: Conjunctivae and EOM are normal. PERRLA, no scleral icterus. Neck: Normal ROM. Neck supple. No JVD. No tracheal deviation. No thyromegaly. CVS: RRR, S1/S2 +, no murmurs, no gallops, no carotid bruit.  Pulmonary: Effort and breath sounds normal, no stridor, rhonchi, wheezes, rales.  Abdominal: Soft. BS +,  no distension, tenderness,  rebound or guarding.  Musculoskeletal: Normal range of motion. No edema and no tenderness.  Lymphadenopathy: No lymphadenopathy noted, cervical, inguinal or axillary Neuro: Alert. Normal reflexes, muscle tone coordination. No cranial nerve deficit. Skin: Skin is warm and dry. No rash noted. Not diaphoretic. No erythema. No pallor. Psychiatric: Normal mood and affect. Behavior, judgment, thought content normal.  Lab Results  Component Value Date   WBC 9.2 06/12/2014   HGB 14.0 06/12/2014   HCT 39.7 06/12/2014   MCV 83.6 06/12/2014   PLT 252 06/12/2014   Lab Results  Component Value Date   CREATININE 0.57 06/12/2014   BUN 9 06/12/2014   NA 140 06/12/2014   K 3.1* 06/12/2014   CL 102 06/12/2014   CO2 24 06/12/2014    Lab Results  Component Value Date   HGBA1C 7.2 08/26/2014   Lipid Panel     Component Value Date/Time   CHOL 235* 07/04/2014 0859   TRIG 69 07/04/2014 0859   HDL 89 07/04/2014 0859   CHOLHDL 2.6 07/04/2014 0859   VLDL 14 07/04/2014 0859   LDLCALC 132* 07/04/2014 0859       Assessment and plan:   [name] was seen today for follow-up.  Diagnoses and all orders for this visit:  Type 2 diabetes mellitus without complication Orders: -     Glucose (CBG) -     metFORMIN (GLUCOPHAGE XR) 500 MG 24 hr tablet; Take 1 tablet (500 mg total) by mouth daily with breakfast. Change metformin to extended release or better tolerability.   Arthritis Orders: -     celecoxib (CELEBREX) 100 MG capsule; Take 1 capsule (100 mg total) by mouth daily. -     traMADol (ULTRAM) 50 MG tablet; Take 1 tablet (50 mg total) by mouth every 8 (eight) hours as needed. Change patient from diclofenac to low-dose Celebrex for better tolerability. May use tramadol with severe pain as needed.   Nausea without vomiting Orders: -     promethazine (PHENERGAN) 12.5 MG tablet; Take 1 tablet (12.5 mg total) by mouth every 6 (six) hours as needed for nausea or vomiting.  Return for 6-8 weeks DM  f/u .        Holland Commons, NP-C [facility] [phone] 11/12/2014, 3:39 PM

## 2015-02-09 ENCOUNTER — Other Ambulatory Visit: Payer: Self-pay | Admitting: Internal Medicine

## 2015-02-17 ENCOUNTER — Other Ambulatory Visit: Payer: Self-pay | Admitting: Internal Medicine

## 2015-02-17 ENCOUNTER — Telehealth: Payer: Self-pay | Admitting: Internal Medicine

## 2015-02-17 DIAGNOSIS — I10 Essential (primary) hypertension: Secondary | ICD-10-CM

## 2015-02-17 MED ORDER — AMLODIPINE BESYLATE 5 MG PO TABS: 5.0000 mg | ORAL_TABLET | Freq: Every day | ORAL | Status: DC

## 2015-02-17 NOTE — Telephone Encounter (Signed)
I have sent 30 days of tablets. She needs to make a appointment for follow up

## 2015-02-17 NOTE — Telephone Encounter (Signed)
Pt came into office requesting medication refill on amLODipine (NORVASC) 5 MG tablet Patient is completely out of medication. [facility]. Please f/u with patient

## 2015-02-18 ENCOUNTER — Telehealth: Payer: Self-pay | Admitting: *Deleted

## 2015-02-18 NOTE — Telephone Encounter (Signed)
Left message that patient will need to make an appointment for future refills

## 2015-03-16 ENCOUNTER — Other Ambulatory Visit: Payer: Self-pay

## 2015-03-16 ENCOUNTER — Encounter: Payer: Self-pay | Admitting: Internal Medicine

## 2015-03-16 ENCOUNTER — Ambulatory Visit: Payer: Self-pay | Attending: Internal Medicine | Admitting: Internal Medicine

## 2015-03-16 VITALS — BP 139/78 | HR 94 | Temp 99.2°F | Resp 16 | Wt 120.6 lb

## 2015-03-16 DIAGNOSIS — I10 Essential (primary) hypertension: Secondary | ICD-10-CM

## 2015-03-16 DIAGNOSIS — E785 Hyperlipidemia, unspecified: Secondary | ICD-10-CM

## 2015-03-16 DIAGNOSIS — M1990 Unspecified osteoarthritis, unspecified site: Secondary | ICD-10-CM | POA: Insufficient documentation

## 2015-03-16 DIAGNOSIS — E119 Type 2 diabetes mellitus without complications: Secondary | ICD-10-CM

## 2015-03-16 DIAGNOSIS — J988 Other specified respiratory disorders: Secondary | ICD-10-CM

## 2015-03-16 LAB — POCT GLYCOSYLATED HEMOGLOBIN (HGB A1C): HEMOGLOBIN A1C: 7.1

## 2015-03-16 LAB — GLUCOSE, POCT (MANUAL RESULT ENTRY): POC Glucose: 187 mg/dl — AB (ref 70–99)

## 2015-03-16 MED ORDER — ATORVASTATIN CALCIUM 20 MG PO TABS: 20.0000 mg | ORAL_TABLET | Freq: Every day | ORAL | Status: DC

## 2015-03-16 MED ORDER — METFORMIN HCL ER 500 MG PO TB24: 500.0000 mg | ORAL_TABLET | Freq: Every day | ORAL | Status: DC

## 2015-03-16 MED ORDER — AZITHROMYCIN 250 MG PO TABS
ORAL_TABLET | ORAL | Status: DC
Start: 2015-03-16 — End: 2016-12-28

## 2015-03-16 MED ORDER — AMLODIPINE BESYLATE 5 MG PO TABS: 5.0000 mg | ORAL_TABLET | Freq: Every day | ORAL | Status: DC

## 2015-03-16 MED ORDER — TRAMADOL HCL 50 MG PO TABS: 50.0000 mg | ORAL_TABLET | Freq: Two times a day (BID) | ORAL | Status: DC | PRN

## 2015-03-16 NOTE — Progress Notes (Signed)
Patient ID: Nicole Yates, female   DOB: 06-10-1956, 59 y.o.   MRN: [id number] SUBJECTIVE: 59 y.o. female for follow up of diabetes. Diabetic Review of Systems - medication compliance: compliant all of the time, diabetic diet compliance: compliant all of the time, home glucose monitoring: is performed sporadically, further diabetic ROS: no polyuria or polydipsia, no chest pain, dyspnea or TIA's, no unusual visual symptoms.  Other symptoms and concerns: Some tingling in her right hand and arm. She states that the pain is becoming more severe over the past couple of months. She states that she has been taking her husbands vicodin for pain and has been unable to sleep. She is tearful during exam discussing pain.   She also has had sore throat, cough, congestion, with yellow mucous production for 4 days. No fevers, chills, nausea, or vomiting.   Current Outpatient Prescriptions  Medication Sig Dispense Refill  . albuterol (PROVENTIL HFA;VENTOLIN HFA) 108 (90 BASE) MCG/ACT inhaler Inhale 2 puffs into the lungs every 6 (six) hours as needed for wheezing or shortness of breath. 1 Inhaler 2  . amLODipine (NORVASC) 5 MG tablet Take 1 tablet (5 mg total) by mouth daily. 30 tablet 0  . atorvastatin (LIPITOR) 20 MG tablet Take 1 tablet (20 mg total) by mouth daily. 90 tablet 3  . celecoxib (CELEBREX) 100 MG capsule Take 1 capsule (100 mg total) by mouth daily. 30 capsule 3  . diclofenac (CATAFLAM) 50 MG tablet Take 1 tablet (50 mg total) by mouth 3 (three) times daily. 60 tablet 3  . insulin aspart (NOVOLOG) 100 UNIT/ML injection Inject 3 Units into the skin 3 (three) times daily with meals. 30 mL 3  . insulin glargine (LANTUS) 100 UNIT/ML injection Inject 0.1 mLs (10 Units total) into the skin daily. 15 mL 3  . Insulin Syringe-Needle U-100 30G X 3/8" 0.3 ML MISC 1 Syringe by Does not apply route 4 (four) times daily. 120 each 1  . metFORMIN (GLUCOPHAGE XR) 500 MG 24 hr tablet Take 1 tablet (500 mg total) by  mouth daily with breakfast. 30 tablet 4  . Multiple Vitamin (MULTIVITAMIN WITH MINERALS) TABS tablet Take 1 tablet by mouth daily.    . traMADol (ULTRAM) 50 MG tablet Take 1 tablet (50 mg total) by mouth every 8 (eight) hours as needed. 60 tablet 0  . promethazine (PHENERGAN) 12.5 MG tablet Take 1 tablet (12.5 mg total) by mouth every 6 (six) hours as needed for nausea or vomiting. (Patient not taking: Reported on 03/16/2015) 30 tablet 0   No current facility-administered medications for this visit.    OBJECTIVE: Appearance: alert, well appearing, and in no distress and oriented to person, place, and time. BP 139/78 mmHg  Pulse 94  Temp(Src) 99.2 F (37.3 C) (Oral)  Resp 16  Wt 120 lb 9.6 oz (54.704 kg)  SpO2 97%  Exam: heart sounds normal rate, regular rhythm, normal S1, S2, no murmurs, rubs, clicks or gallops, no JVD, no carotid bruits, Lungs---rhonchi bilaterally   Nicole Yates was seen today for diabetes  follow up, hip/ knee pain and nasal congestion.  Diagnoses and all orders for this visit:  Type 2 diabetes mellitus without complication Orders: -     Glucose (CBG) -     POCT glycosylated hemoglobin (Hb A1C) -     metFORMIN (GLUCOPHAGE XR) 500 MG 24 hr tablet; Take 1 tablet (500 mg total) by mouth daily with breakfast. Patients diabetes is well control as evidence by consistently low a1c.  Patient  will continue with current therapy and continue to make necessary lifestyle changes.  Reviewed foot care, diet, exercise, annual health maintenance with patient.  Ophthalmology discussed.  Essential hypertension Orders: -     amLODipine (NORVASC) 5 MG tablet; Take 1 tablet (5 mg total) by mouth daily. Patient blood pressure is stable and may continue on current medication.  Education on diet, exercise, and modifiable risk factors discussed. Will obtain appropriate labs as needed. Will follow up in 3-6 months.   HLD (hyperlipidemia) Orders: -     atorvastatin (LIPITOR) 20 MG tablet; Take  1 tablet (20 mg total) by mouth daily. Education provided on proper lifestyle changes in order to lower cholesterol. Patient advised to maintain healthy weight and to keep total fat intake at 25-35% of total calories and carbohydrates 50-60% of total daily calories. Explained how high cholesterol places patient at risk for heart disease. Patient placed on appropriate medication and repeat labs in 6 months   Arthritis Orders: -     traMADol (ULTRAM) 50 MG tablet; Take 1 tablet (50 mg total) by mouth every 12 (twelve) hours as needed. Tylenol for pain. Pain mostly in left hip, she would liek referral to ortho once she receives hospital discount  Respiratory infection Orders: -     azithromycin (ZITHROMAX) 250 MG tablet; Take 2 today and 1 pill each day after until completed  Return in about 3 months (around 06/16/2015) for DM/HTN.  Holland CommonsKECK, Kyaira Trantham, NP 03/16/2015 10:01 PM

## 2015-03-16 NOTE — Progress Notes (Signed)
  Pt is here to follow up on Diabetes follow up. She is requesting refill on her medicine. She also c/o left leg/knee pain.

## 2015-03-16 NOTE — Patient Instructions (Signed)
Please apply for the hospital discount and call me back so that I may place referral for Orthopedics.  I cannot give you Xanax currently since you are taking tramadol.

## 2015-07-15 ENCOUNTER — Other Ambulatory Visit: Payer: Self-pay

## 2015-07-15 ENCOUNTER — Other Ambulatory Visit: Payer: Self-pay | Admitting: Internal Medicine

## 2015-07-15 MED ORDER — TRUEPLUS LANCETS 28G MISC
1.0000 | Freq: Three times a day (TID) | Status: DC
Start: 2015-07-15 — End: 2016-08-17

## 2015-07-15 MED ORDER — TRUE METRIX METER W/DEVICE KIT: 1.0000 | PACK | Freq: Three times a day (TID) | Status: DC

## 2015-07-15 MED ORDER — GLUCOSE BLOOD VI STRP
ORAL_STRIP | Status: DC
Start: 2015-07-15 — End: 2016-08-17

## 2015-07-22 ENCOUNTER — Other Ambulatory Visit: Payer: Self-pay | Admitting: Pharmacist

## 2015-07-22 MED ORDER — TRUE METRIX METER W/DEVICE KIT: 1.0000 | PACK | Freq: Three times a day (TID) | Status: DC

## 2015-08-31 ENCOUNTER — Other Ambulatory Visit: Payer: Self-pay | Admitting: *Deleted

## 2015-09-01 ENCOUNTER — Other Ambulatory Visit: Payer: Self-pay | Admitting: *Deleted

## 2015-09-01 MED ORDER — ALBUTEROL SULFATE HFA 108 (90 BASE) MCG/ACT IN AERS: 2.0000 | INHALATION_SPRAY | Freq: Four times a day (QID) | RESPIRATORY_TRACT | Status: DC | PRN

## 2015-09-09 ENCOUNTER — Other Ambulatory Visit: Payer: Self-pay | Admitting: Internal Medicine

## 2015-09-15 ENCOUNTER — Other Ambulatory Visit: Payer: Self-pay | Admitting: Internal Medicine

## 2015-10-05 MED FILL — TRUE METRIX TEST STRIP: 25 days supply | Qty: 100 | Fill #1

## 2015-10-12 ENCOUNTER — Ambulatory Visit: Payer: Self-pay | Attending: Internal Medicine | Admitting: Internal Medicine

## 2015-10-12 ENCOUNTER — Encounter: Payer: Self-pay | Admitting: Internal Medicine

## 2015-10-12 VITALS — BP 152/84 | HR 92 | Temp 98.0°F | Resp 16 | Ht 64.0 in | Wt 134.6 lb

## 2015-10-12 DIAGNOSIS — E119 Type 2 diabetes mellitus without complications: Secondary | ICD-10-CM | POA: Insufficient documentation

## 2015-10-12 DIAGNOSIS — E785 Hyperlipidemia, unspecified: Secondary | ICD-10-CM | POA: Insufficient documentation

## 2015-10-12 DIAGNOSIS — I10 Essential (primary) hypertension: Secondary | ICD-10-CM | POA: Insufficient documentation

## 2015-10-12 DIAGNOSIS — Z1239 Encounter for other screening for malignant neoplasm of breast: Secondary | ICD-10-CM

## 2015-10-12 DIAGNOSIS — Z7984 Long term (current) use of oral hypoglycemic drugs: Secondary | ICD-10-CM | POA: Insufficient documentation

## 2015-10-12 DIAGNOSIS — Z7982 Long term (current) use of aspirin: Secondary | ICD-10-CM | POA: Insufficient documentation

## 2015-10-12 DIAGNOSIS — Z794 Long term (current) use of insulin: Secondary | ICD-10-CM | POA: Insufficient documentation

## 2015-10-12 DIAGNOSIS — Z79899 Other long term (current) drug therapy: Secondary | ICD-10-CM | POA: Insufficient documentation

## 2015-10-12 LAB — POCT GLYCOSYLATED HEMOGLOBIN (HGB A1C): Hemoglobin A1C: 9.6

## 2015-10-12 LAB — GLUCOSE, POCT (MANUAL RESULT ENTRY): POC GLUCOSE: 218 mg/dL — AB (ref 70–99)

## 2015-10-12 MED ORDER — INSULIN GLARGINE 100 UNIT/ML ~~LOC~~ SOLN: 10.0000 [IU] | Freq: Every day | SUBCUTANEOUS | Status: DC

## 2015-10-12 MED ORDER — ATORVASTATIN CALCIUM 20 MG PO TABS: 20.0000 mg | ORAL_TABLET | Freq: Every day | ORAL | Status: DC

## 2015-10-12 MED ORDER — METFORMIN HCL ER 500 MG PO TB24: 500.0000 mg | ORAL_TABLET | Freq: Every day | ORAL | Status: DC

## 2015-10-12 MED ORDER — AMLODIPINE BESYLATE 5 MG PO TABS: 5.0000 mg | ORAL_TABLET | Freq: Every day | ORAL | Status: DC

## 2015-10-12 MED ORDER — INSULIN ASPART 100 UNIT/ML ~~LOC~~ SOLN: 3.0000 [IU] | Freq: Three times a day (TID) | SUBCUTANEOUS | Status: DC

## 2015-10-12 MED FILL — METFORMIN HCL ER 500 MG TAB: 500 | 30 days supply | Qty: 30 | Fill #0

## 2015-10-12 MED FILL — $novoLOG 100 UNITS/ML VIAL: 100 | 28 days supply | Qty: 10 | Fill #0

## 2015-10-12 MED FILL — ?ATORVASTATIN 20 MG TABLET: 20 | 30 days supply | Qty: 30 | Fill #0

## 2015-10-12 MED FILL — AMLODIPINE BESYLATE 5 MG TA: 5 | 30 days supply | Qty: 30 | Fill #0

## 2015-10-12 NOTE — Patient Instructions (Signed)
Please call Sabrina Holland, [phone],  with the [facility] (breast and cervical cancer control program) at the [facility] to set up an appointment to verify eligibility for a breast exam, mammogram, ultrasound. If you qualify this will be set up at [facility].   

## 2015-10-12 NOTE — Progress Notes (Signed)
Patient here for follow up on diabetes and for the flu vaccine

## 2015-10-12 NOTE — Progress Notes (Signed)
Patient ID: Nicole Yates, female   DOB: 06/27/1956, 60 y.o.   MRN: [id number] SUBJECTIVE: 60 y.o. female for follow up of diabetes, hypertension, and HLD. Diabetic Review of Systems - medication compliance: noncompliant some of the time, diabetic diet compliance: noncompliant some of the time, home glucose monitoring: is performed sporadically, further diabetic ROS: no polyuria or polydipsia, no chest pain, dyspnea or TIA's, no numbness, tingling or pain in extremities, no unusual visual symptoms, no hypoglycemia.  Other symptoms and concerns: Reports that now her nausea has improved so she has been able to eat so she has been eating more.  Current Outpatient Prescriptions  Medication Sig Dispense Refill  . amLODipine (NORVASC) 5 MG tablet Take 1 tablet (5 mg total) by mouth daily. 30 tablet 5  . aspirin 81 MG tablet Take 81 mg by mouth daily.    Marland Kitchen atorvastatin (LIPITOR) 20 MG tablet Take 1 tablet (20 mg total) by mouth daily. 30 tablet 5  . metFORMIN (GLUCOPHAGE XR) 500 MG 24 hr tablet Take 1 tablet (500 mg total) by mouth daily with breakfast. 30 tablet 5  . Multiple Vitamin (MULTIVITAMIN WITH MINERALS) TABS tablet Take 1 tablet by mouth daily.    . traMADol (ULTRAM) 50 MG tablet Take 1 tablet (50 mg total) by mouth every 12 (twelve) hours as needed. 60 tablet 0  . albuterol (PROVENTIL HFA;VENTOLIN HFA) 108 (90 BASE) MCG/ACT inhaler Inhale 2 puffs into the lungs every 6 (six) hours as needed for wheezing or shortness of breath. 1 Inhaler 2  . azithromycin (ZITHROMAX) 250 MG tablet Take 2 today and 1 pill each day after until completed 6 tablet 0  . Blood Glucose Monitoring Suppl (TRUE METRIX METER) W/DEVICE KIT 1 each by Does not apply route 4 (four) times daily -  before meals and at bedtime. 1 kit 0  . diclofenac (CATAFLAM) 50 MG tablet Take 1 tablet (50 mg total) by mouth 3 (three) times daily. 60 tablet 3  . glucose blood (TRUE METRIX BLOOD GLUCOSE TEST) test strip Use as instructed 100 each 12   . insulin aspart (NOVOLOG) 100 UNIT/ML injection Inject 3 Units into the skin 3 (three) times daily with meals. 30 mL 3  . insulin glargine (LANTUS) 100 UNIT/ML injection Inject 0.1 mLs (10 Units total) into the skin daily. 10 mL 5  . Insulin Syringe-Needle U-100 30G X 3/8" 0.3 ML MISC 1 Syringe by Does not apply route 4 (four) times daily. 120 each 1  . promethazine (PHENERGAN) 12.5 MG tablet Take 1 tablet (12.5 mg total) by mouth every 6 (six) hours as needed for nausea or vomiting. (Patient not taking: Reported on 03/16/2015) 30 tablet 0  . TRUEPLUS LANCETS 28G MISC 1 each by Does not apply route 4 (four) times daily -  before meals and at bedtime. 100 each 5   No current facility-administered medications for this visit.  Review of Systems: Other than what is stated in HPI, all other systems are negative.   OBJECTIVE: Appearance: alert, well appearing, and in no distress, oriented to person, place, and time and normal appearing weight. BP 152/84 mmHg  Pulse 92  Temp(Src) 98 F (36.7 C)  Resp 16  Ht '5\' 4"'$  (1.626 m)  Wt 134 lb 9.6 oz (61.054 kg)  BMI 23.09 kg/m2  SpO2 100%  Exam: heart sounds normal rate, regular rhythm, normal S1, S2, no murmurs, rubs, clicks or gallops, no JVD, chest clear, no hepatosplenomegaly, no carotid bruits  ASSESSMENT: [name] was seen today  for follow-up.  Diagnoses and all orders for this visit:  Type 2 diabetes mellitus without complication, with long-term current use of insulin (HCC) -     Glucose (CBG) -     HgB A1c -     Flu Vaccine QUAD 36+ mos PF IM (Fluarix & Fluzone Quad PF) -     Microalbumin, urine -     Lipid panel; Future -     COMPLETE METABOLIC PANEL WITH GFR; Future -     insulin glargine (LANTUS) 100 UNIT/ML injection; Inject 0.1 mLs (10 Units total) into the skin daily. -     insulin aspart (NOVOLOG) 100 UNIT/ML injection; Inject 3 Units into the skin 3 (three) times daily with meals. -     metFORMIN (GLUCOPHAGE XR) 500 MG 24 hr  tablet; Take 1 tablet (500 mg total) by mouth daily with breakfast. Patient has not been consistent with her medications or diet. I have really stressed that she gets back on track.   Essential hypertension -     amLODipine (NORVASC) 5 MG tablet; Take 1 tablet (5 mg total) by mouth daily. Patient blood pressure is stable and may continue on current medication.  Education on diet, exercise, and modifiable risk factors discussed. Will obtain appropriate labs as needed. Will follow up in 3-6 months.   HLD (hyperlipidemia) -     atorvastatin (LIPITOR) 20 MG tablet; Take 1 tablet (20 mg total) by mouth daily. Education provided on proper lifestyle changes in order to lower cholesterol. Patient advised to maintain healthy weight and to keep total fat intake at 25-35% of total calories and carbohydrates 50-60% of total daily calories. Explained how high cholesterol places patient at risk for heart disease. Patient placed on appropriate medication and repeat labs in 6 months   Breast cancer screening -     MM Digital Screening; Future   Return for this week lab visit and 3 mo PCP .   Lance Bosch, NP 10/12/2015 7:08 PM

## 2015-10-13 ENCOUNTER — Ambulatory Visit: Payer: Self-pay | Attending: Internal Medicine

## 2015-10-13 DIAGNOSIS — Z794 Long term (current) use of insulin: Principal | ICD-10-CM

## 2015-10-13 DIAGNOSIS — E119 Type 2 diabetes mellitus without complications: Secondary | ICD-10-CM

## 2015-10-13 LAB — COMPLETE METABOLIC PANEL WITH GFR
ALBUMIN: 4.3 g/dL (ref 3.6–5.1)
ALK PHOS: 95 U/L (ref 33–130)
ALT: 14 U/L (ref 6–29)
AST: 14 U/L (ref 10–35)
BUN: 13 mg/dL (ref 7–25)
CALCIUM: 10.1 mg/dL (ref 8.6–10.4)
CHLORIDE: 102 mmol/L (ref 98–110)
CO2: 28 mmol/L (ref 20–31)
CREATININE: 0.77 mg/dL (ref 0.50–0.99)
GFR, Est African American: >89 mL/min (ref >=60)
GFR, Est Non African American: 84 mL/min (ref >=60)
GLUCOSE: 248 mg/dL — AB (ref 65–99)
Potassium: 5.5 mmol/L — ABNORMAL HIGH (ref 3.5–5.3)
Sodium: 139 mmol/L (ref 135–146)
Total Bilirubin: 0.4 mg/dL (ref 0.2–1.2)
Total Protein: 6.8 g/dL (ref 6.1–8.1)

## 2015-10-13 LAB — LIPID PANEL
CHOLESTEROL: 172 mg/dL (ref 125–200)
HDL: 83 mg/dL (ref >=46)
LDL Cholesterol: 78 mg/dL (ref <130)
Total CHOL/HDL Ratio: 2.1 Ratio (ref <=5.0)
Triglycerides: 56 mg/dL (ref <150)
VLDL: 11 mg/dL (ref <30)

## 2015-10-13 LAB — MICROALBUMIN, URINE: Microalb, Ur: 0.7 mg/dL

## 2015-10-14 ENCOUNTER — Telehealth: Payer: Self-pay

## 2015-10-14 ENCOUNTER — Other Ambulatory Visit: Payer: Self-pay | Admitting: Internal Medicine

## 2015-10-14 DIAGNOSIS — Z1231 Encounter for screening mammogram for malignant neoplasm of breast: Secondary | ICD-10-CM

## 2015-10-14 MED ORDER — FUROSEMIDE 20 MG PO TABS: 20.0000 mg | ORAL_TABLET | Freq: Every day | ORAL | Status: DC

## 2015-10-14 MED FILL — ?FUROSEMIDE 20 MG TABLET: 20 | 3 days supply | Qty: 3 | Fill #0

## 2015-10-14 MED FILL — !LEVEMIR 100 UNITS/ML VIAL: 100/ML | 90 days supply | Qty: 10 | Fill #0

## 2015-10-14 NOTE — Telephone Encounter (Signed)
-----   Message from Ambrose Finland, NP sent at 10/14/2015  9:33 AM EST ----- Cholesterol looks great. Please send lasix 20 mg to take once per day for 3 days. 3 tablets no refills. Potassium is slightly elevated.

## 2015-10-14 NOTE — Telephone Encounter (Signed)
Spoke with patient this am and she is aware of her lab results and to pick Up her lasix at the pharmacy here

## 2015-11-04 ENCOUNTER — Telehealth (HOSPITAL_COMMUNITY): Payer: Self-pay | Admitting: *Deleted

## 2015-11-04 NOTE — Telephone Encounter (Signed)
Telephoned patient at home # and left message to return call to BCCCP 

## 2015-11-11 ENCOUNTER — Other Ambulatory Visit: Payer: Self-pay | Admitting: Internal Medicine

## 2015-11-11 DIAGNOSIS — E119 Type 2 diabetes mellitus without complications: Secondary | ICD-10-CM

## 2015-11-11 DIAGNOSIS — Z794 Long term (current) use of insulin: Principal | ICD-10-CM

## 2015-11-11 MED ORDER — INSULIN GLARGINE 100 UNIT/ML ~~LOC~~ SOLN: 10.0000 [IU] | Freq: Every day | SUBCUTANEOUS | Status: DC

## 2015-11-11 MED ORDER — INSULIN ASPART 100 UNIT/ML ~~LOC~~ SOLN: 3.0000 [IU] | Freq: Three times a day (TID) | SUBCUTANEOUS | Status: DC

## 2015-11-17 ENCOUNTER — Telehealth (HOSPITAL_COMMUNITY): Payer: Self-pay | Admitting: *Deleted

## 2015-11-17 NOTE — Telephone Encounter (Signed)
Telephoned patient at home # and left message to return call to BCCCP 

## 2015-11-25 MED FILL — METFORMIN HCL ER 500 MG TAB: 500 | 30 days supply | Qty: 30 | Fill #1

## 2015-11-25 MED FILL — ATORVASTATIN 20 MG TABLET: 20 | 30 days supply | Qty: 30 | Fill #1

## 2015-11-25 MED FILL — AMLODIPINE BESYLATE 5 MG TA: 5 | 30 days supply | Qty: 30 | Fill #1

## 2015-11-25 MED FILL — VENTOLIN HFA 90 MCG INHALER: 108 (90 BAS | 25 days supply | Qty: 18 | Fill #1

## 2015-11-25 MED FILL — TRUE METRIX TEST STRIP: 25 days supply | Qty: 100 | Fill #2

## 2015-12-23 MED FILL — AMLODIPINE BESYLATE 5 MG TA: 5 | 30 days supply | Qty: 30 | Fill #2

## 2015-12-23 MED FILL — METFORMIN HCL ER 500 MG TAB: 500 | 30 days supply | Qty: 30 | Fill #2

## 2015-12-23 MED FILL — ATORVASTATIN 20 MG TABLET: 20 | 30 days supply | Qty: 30 | Fill #2

## 2015-12-30 ENCOUNTER — Telehealth (HOSPITAL_COMMUNITY): Payer: Self-pay | Admitting: *Deleted

## 2015-12-30 NOTE — Telephone Encounter (Signed)
Telephoned patient at home # and left message to return call to BCCCP 

## 2016-01-12 ENCOUNTER — Telehealth (HOSPITAL_COMMUNITY): Payer: Self-pay | Admitting: *Deleted

## 2016-01-12 NOTE — Telephone Encounter (Signed)
Telephoned patient at home # and left message to return call to BCCCP 

## 2016-01-18 MED FILL — ATORVASTATIN 20 MG TABLET: 20 | 30 days supply | Qty: 30 | Fill #3

## 2016-01-18 MED FILL — METFORMIN HCL ER 500 MG TAB: 500 | 30 days supply | Qty: 30 | Fill #3

## 2016-01-18 MED FILL — ?AMLODIPINE BESYLATE 5 MG T: 5 | 30 days supply | Qty: 30 | Fill #3

## 2016-02-22 MED FILL — METFORMIN HCL ER 500 MG TAB: 500 | 30 days supply | Qty: 30 | Fill #4

## 2016-02-22 MED FILL — ATORVASTATIN 20 MG TABLET: 20 | 30 days supply | Qty: 30 | Fill #4

## 2016-02-22 MED FILL — ?AMLODIPINE BESYLATE 5 MG T: 5 | 30 days supply | Qty: 30 | Fill #4

## 2016-02-22 MED FILL — TRUE METRIX TEST STRIP: 25 days supply | Qty: 100 | Fill #3

## 2016-02-22 MED FILL — VENTOLIN HFA 90 MCG INHALER: 108 (90 BAS | 25 days supply | Qty: 18 | Fill #2

## 2016-02-23 MED FILL — !LEVEMIR 100 UNITS/ML VIAL: 100/ML | 90 days supply | Qty: 10 | Fill #1

## 2016-02-23 MED FILL — $novoLOG 100 UNITS/ML VIAL: 100 | 90 days supply | Qty: 10 | Fill #1

## 2016-03-24 MED FILL — ?ATORVASTATIN 20 MG TABLET: 20 | 30 days supply | Qty: 30 | Fill #5

## 2016-03-24 MED FILL — ?AMLODIPINE BESYLATE 5 MG T: 5 | 30 days supply | Qty: 30 | Fill #5

## 2016-03-25 MED FILL — METFORMIN HCL ER 500 MG TAB: 500 | 30 days supply | Qty: 30 | Fill #5

## 2016-03-28 ENCOUNTER — Telehealth (HOSPITAL_COMMUNITY): Payer: Self-pay | Admitting: *Deleted

## 2016-03-28 NOTE — Telephone Encounter (Signed)
Telephoned patient at home # and left message to return call to BCCCP 

## 2016-03-31 ENCOUNTER — Other Ambulatory Visit: Payer: Self-pay | Admitting: Obstetrics and Gynecology

## 2016-03-31 DIAGNOSIS — Z1231 Encounter for screening mammogram for malignant neoplasm of breast: Secondary | ICD-10-CM

## 2016-04-26 ENCOUNTER — Other Ambulatory Visit: Payer: Self-pay | Admitting: Internal Medicine

## 2016-04-26 DIAGNOSIS — E785 Hyperlipidemia, unspecified: Secondary | ICD-10-CM

## 2016-04-26 DIAGNOSIS — I10 Essential (primary) hypertension: Secondary | ICD-10-CM

## 2016-04-26 DIAGNOSIS — Z794 Long term (current) use of insulin: Secondary | ICD-10-CM

## 2016-04-26 DIAGNOSIS — E119 Type 2 diabetes mellitus without complications: Secondary | ICD-10-CM

## 2016-04-26 MED FILL — ?ATORVASTATIN 20 MG TABLET: 20 | 30 days supply | Qty: 30 | Fill #0

## 2016-04-26 MED FILL — VENTOLIN HFA 90 MCG INHALER: 108 (90 BAS | 30 days supply | Qty: 18 | Fill #0

## 2016-04-26 MED FILL — ?AMLODIPINE BESYLATE 5 MG T: 5 | 30 days supply | Qty: 30 | Fill #0

## 2016-04-26 MED FILL — ?METFORMIN HCL 500MG TABLET: 500 | 30 days supply | Qty: 30 | Fill #0

## 2016-04-27 ENCOUNTER — Telehealth (HOSPITAL_COMMUNITY): Payer: Self-pay | Admitting: *Deleted

## 2016-04-27 NOTE — Telephone Encounter (Signed)
Telephoned patient at home # and left message on voicemail concerning BCCCP appointment Thursday August 10 3:30

## 2016-04-28 ENCOUNTER — Ambulatory Visit (HOSPITAL_COMMUNITY)
Admission: RE | Admit: 2016-04-28 | Discharge: 2016-04-28 | Disposition: A | Discharge status: Home / Self Care | Payer: Self-pay | Source: Ambulatory Visit | Attending: Obstetrics and Gynecology | Admitting: Obstetrics and Gynecology

## 2016-04-28 ENCOUNTER — Ambulatory Visit
Admission: RE | Admit: 2016-04-28 | Discharge: 2016-04-28 | Disposition: A | Discharge status: Home / Self Care | Payer: No Typology Code available for payment source | Source: Ambulatory Visit | Attending: Obstetrics and Gynecology | Admitting: Obstetrics and Gynecology

## 2016-04-28 ENCOUNTER — Encounter (HOSPITAL_COMMUNITY): Payer: Self-pay

## 2016-04-28 VITALS — BP 142/80 | Temp 98.6°F | Ht 63.5 in | Wt 135.6 lb

## 2016-04-28 DIAGNOSIS — Z1231 Encounter for screening mammogram for malignant neoplasm of breast: Secondary | ICD-10-CM

## 2016-04-28 DIAGNOSIS — Z1239 Encounter for other screening for malignant neoplasm of breast: Secondary | ICD-10-CM

## 2016-04-28 NOTE — Progress Notes (Signed)
No complaints today.   Pap Smear:  Pap smear not completed today. Last Pap smear was 03/24/09 and normal per patient. Per patient has no history of an abnormal Pap smear. Patient has a history of a hysterectomy for AUB in 1983. Patient no longer needs Pap smears due to her history of a hysterectomy for benign reasons per BCCCP and ACOG guidelines. Last Pap smear result is in EPIC.  Physical exam: Breasts Breasts symmetrical. No skin abnormalities bilateral breasts. No nipple retraction bilateral breasts. No nipple discharge bilateral breasts. No lymphadenopathy. No lumps palpated bilateral breasts. No complaints of pain or tenderness on exam. [facility]. Appointment scheduled for Thursday, April 28, 2016 at 1620.        Pelvic/Bimanual No Pap smear completed today since patient has a history of a hysterectomy for benign reasons. Pap smear not indicated per BCCCP guidelines.   Smoking History: Patient has never smoked.  Patient Navigation: Patient education provided. Access to services provided for patient through BCCCP program.   Colorectal Cancer Screening: Patient had a colonoscopy completed 10 years ago. No complaints today.

## 2016-04-28 NOTE — Patient Instructions (Signed)
Explained breast self awareness to Nicole Yates. Patient did not need a Pap smear today due to patient has a history of a hysterectomy for benign reasons. Let patient know that she doesn't need any further Pap smears due to her history of a hysterectomy for benign reasons. [facility] mamm[facility]. Appointment scheduled for Thursday, April 28, 2016 at 1620. Let patient know the [facility] will follow up with her within the next couple weeks with results of mammogram by letter or phone. Nicole Yates verbalized understanding.  Zanna Hawn, Kathaleen Maserhristine Poll, RN 4:36 PM

## 2016-05-02 ENCOUNTER — Encounter (HOSPITAL_COMMUNITY): Payer: Self-pay | Admitting: *Deleted

## 2016-05-26 ENCOUNTER — Other Ambulatory Visit: Payer: Self-pay | Admitting: Internal Medicine

## 2016-05-26 DIAGNOSIS — E119 Type 2 diabetes mellitus without complications: Secondary | ICD-10-CM

## 2016-05-26 DIAGNOSIS — Z794 Long term (current) use of insulin: Secondary | ICD-10-CM

## 2016-05-26 DIAGNOSIS — I10 Essential (primary) hypertension: Secondary | ICD-10-CM

## 2016-05-26 DIAGNOSIS — E785 Hyperlipidemia, unspecified: Secondary | ICD-10-CM

## 2016-05-26 MED FILL — ?METFORMIN HCL 500MG TABLET: 500 | 30 days supply | Qty: 30 | Fill #0

## 2016-05-26 MED FILL — ?AMLODIPINE BESYLATE 5 MG T: 5 | 30 days supply | Qty: 30 | Fill #0

## 2016-05-26 MED FILL — ?ATORVASTATIN 20 MG TABLET: 20 | 30 days supply | Qty: 30 | Fill #0

## 2016-07-04 ENCOUNTER — Other Ambulatory Visit: Payer: Self-pay | Admitting: Internal Medicine

## 2016-07-04 DIAGNOSIS — E785 Hyperlipidemia, unspecified: Secondary | ICD-10-CM

## 2016-07-04 DIAGNOSIS — Z794 Long term (current) use of insulin: Secondary | ICD-10-CM

## 2016-07-04 DIAGNOSIS — E119 Type 2 diabetes mellitus without complications: Secondary | ICD-10-CM

## 2016-07-04 MED FILL — metFORMIN HCL 500 MG TABS: 500 | 30 days supply | Qty: 30 | Fill #0

## 2016-07-04 MED FILL — TRUE METRIX TEST STRIP: 25 days supply | Qty: 100 | Fill #4

## 2016-07-04 MED FILL — VENTOLIN HFA 90 MCG INHALER: 108 (90 BAS | 30 days supply | Qty: 18 | Fill #1

## 2016-07-04 MED FILL — ATORVASTATIN 20 MG TABLET: 20 | 30 days supply | Qty: 30 | Fill #0

## 2016-07-15 MED FILL — $novoLOG 100 UNITS/ML VIAL: 100 | 90 days supply | Qty: 10 | Fill #2

## 2016-07-15 MED FILL — !LEVEMIR 100 UNITS/ML VIAL: 100/ML | 90 days supply | Qty: 10 | Fill #2

## 2016-08-17 ENCOUNTER — Ambulatory Visit: Payer: Self-pay | Attending: Internal Medicine | Admitting: Internal Medicine

## 2016-08-17 ENCOUNTER — Encounter: Payer: Self-pay | Admitting: Licensed Clinical Social Worker

## 2016-08-17 ENCOUNTER — Encounter: Payer: Self-pay | Admitting: Internal Medicine

## 2016-08-17 VITALS — BP 183/80 | HR 76 | Temp 97.9°F | Resp 16 | Wt 132.4 lb

## 2016-08-17 DIAGNOSIS — Z87891 Personal history of nicotine dependence: Secondary | ICD-10-CM | POA: Insufficient documentation

## 2016-08-17 DIAGNOSIS — J449 Chronic obstructive pulmonary disease, unspecified: Secondary | ICD-10-CM

## 2016-08-17 DIAGNOSIS — I10 Essential (primary) hypertension: Secondary | ICD-10-CM

## 2016-08-17 DIAGNOSIS — E785 Hyperlipidemia, unspecified: Secondary | ICD-10-CM

## 2016-08-17 DIAGNOSIS — Z23 Encounter for immunization: Secondary | ICD-10-CM

## 2016-08-17 DIAGNOSIS — F4323 Adjustment disorder with mixed anxiety and depressed mood: Secondary | ICD-10-CM

## 2016-08-17 DIAGNOSIS — M1990 Unspecified osteoarthritis, unspecified site: Secondary | ICD-10-CM

## 2016-08-17 DIAGNOSIS — F419 Anxiety disorder, unspecified: Secondary | ICD-10-CM | POA: Insufficient documentation

## 2016-08-17 DIAGNOSIS — E119 Type 2 diabetes mellitus without complications: Secondary | ICD-10-CM

## 2016-08-17 DIAGNOSIS — Z794 Long term (current) use of insulin: Secondary | ICD-10-CM

## 2016-08-17 LAB — POCT URINALYSIS DIPSTICK
BILIRUBIN UA: NEGATIVE
Blood, UA: NEGATIVE
Glucose, UA: 500
KETONES UA: NEGATIVE
LEUKOCYTES UA: NEGATIVE
NITRITE UA: NEGATIVE
Protein, UA: NEGATIVE
Spec Grav, UA: 1.01
Urobilinogen, UA: 0.2
pH, UA: 6

## 2016-08-17 LAB — GLUCOSE, POCT (MANUAL RESULT ENTRY)
POC GLUCOSE: 417 mg/dL — AB (ref 70–99)
POC Glucose: 213 mg/dl — AB (ref 70–99)

## 2016-08-17 LAB — POCT GLYCOSYLATED HEMOGLOBIN (HGB A1C): Hemoglobin A1C: 10.7

## 2016-08-17 MED ORDER — AMLODIPINE BESYLATE 10 MG PO TABS: 10.0000 mg | ORAL_TABLET | Freq: Every day | ORAL | 3 refills | Status: DC

## 2016-08-17 MED ORDER — MELOXICAM 7.5 MG PO TABS: 7.5000 mg | ORAL_TABLET | Freq: Every day | ORAL | 2 refills | Status: DC | PRN

## 2016-08-17 MED ORDER — INSULIN ASPART 100 UNIT/ML ~~LOC~~ SOLN: 5.0000 [IU] | Freq: Three times a day (TID) | SUBCUTANEOUS | 9 refills | Status: DC

## 2016-08-17 MED ORDER — INSULIN ASPART 100 UNIT/ML ~~LOC~~ SOLN
20.0000 [IU] | Freq: Once | SUBCUTANEOUS | Status: AC
  Administered 2016-08-17: 20 [IU] via SUBCUTANEOUS

## 2016-08-17 MED ORDER — ALBUTEROL SULFATE HFA 108 (90 BASE) MCG/ACT IN AERS: 2.0000 | INHALATION_SPRAY | RESPIRATORY_TRACT | 11 refills | Status: DC | PRN

## 2016-08-17 MED ORDER — ESCITALOPRAM OXALATE 10 MG PO TABS: 5.0000 mg | ORAL_TABLET | Freq: Every day | ORAL | 1 refills | Status: DC

## 2016-08-17 MED ORDER — METFORMIN HCL ER 500 MG PO TB24: 1000.0000 mg | ORAL_TABLET | Freq: Two times a day (BID) | ORAL | 3 refills | Status: DC

## 2016-08-17 MED ORDER — ATORVASTATIN CALCIUM 20 MG PO TABS: 20.0000 mg | ORAL_TABLET | Freq: Every day | ORAL | 3 refills | Status: DC

## 2016-08-17 MED ORDER — INSULIN ASPART 100 UNIT/ML ~~LOC~~ SOLN: 5.0000 [IU] | Freq: Three times a day (TID) | SUBCUTANEOUS | 3 refills | Status: DC

## 2016-08-17 MED ORDER — GLUCOSE BLOOD VI STRP
ORAL_STRIP | 12 refills | Status: DC
Start: 2016-08-17 — End: 2016-11-07

## 2016-08-17 MED ORDER — INSULIN PEN NEEDLE 32G X 4 MM MISC: 1.0000 | Freq: Every day | 2 refills | Status: DC

## 2016-08-17 MED ORDER — ASPIRIN 81 MG PO TABS: 81.0000 mg | ORAL_TABLET | Freq: Every day | ORAL | 3 refills | Status: DC

## 2016-08-17 MED ORDER — TRUEPLUS LANCETS 28G MISC: 1.0000 | Freq: Three times a day (TID) | 5 refills | Status: DC

## 2016-08-17 MED ORDER — INSULIN GLARGINE 100 UNIT/ML SOLOSTAR PEN: 15.0000 [IU] | PEN_INJECTOR | Freq: Every day | SUBCUTANEOUS | 3 refills | Status: DC

## 2016-08-17 MED ORDER — BUDESONIDE-FORMOTEROL FUMARATE 80-4.5 MCG/ACT IN AERO: 2.0000 | INHALATION_SPRAY | Freq: Two times a day (BID) | RESPIRATORY_TRACT | 3 refills | Status: DC

## 2016-08-17 MED FILL — ULTICARE PEN NDL 4MM 32G: 32G X 4 MM | 30 days supply | Qty: 100 | Fill #0

## 2016-08-17 MED FILL — METFORMIN HCL ER 500 MG TAB: 500 | 30 days supply | Qty: 120 | Fill #0

## 2016-08-17 MED FILL — ATORVASTATIN 20 MG TABLET: 20 | 30 days supply | Qty: 30 | Fill #0

## 2016-08-17 MED FILL — ?ESCITALPRAM 10 MG TABLET: 10 MG | 30 days supply | Qty: 30 | Fill #0

## 2016-08-17 MED FILL — TRUEplus LANCETS 28G MISC: 25 days supply | Qty: 100 | Fill #0

## 2016-08-17 MED FILL — VENTOLIN HFA 90 MCG INHALER: 108 (90 BAS | 30 days supply | Qty: 18 | Fill #0

## 2016-08-17 MED FILL — SYMBICORT 80-4.5 MCG INH: 80-4.5 | 30 days supply | Qty: 10 | Fill #0

## 2016-08-17 MED FILL — AMLODIPINE BESYLATE 10 MG T: 10 | 30 days supply | Qty: 30 | Fill #0

## 2016-08-17 MED FILL — LANTUS SOLOSTAR 100 UNITS/M: 100 | 40 days supply | Qty: 6 | Fill #0

## 2016-08-17 MED FILL — MELOXICAM 7.5 MG TABLET: 7.5 | 30 days supply | Qty: 30 | Fill #0

## 2016-08-17 MED FILL — TRUE METRIX TEST STRIP: 25 days supply | Qty: 100 | Fill #0

## 2016-08-17 NOTE — Patient Instructions (Addendum)
- [facility] wks /dm chk.   Check blood sugars on waking up daily., and at least 1-2 times rest of day.    Also check blood sugars about 2 hours after a meal and do this after different meals by rotation  Recommended blood sugar levels on waking up is 90-130 and about 2 hours after meal is 130-160  Please bring your blood sugar monitor to each visit, thank you  Restart exercise  Continue cholesterol med.   - Lantus Titration  Instructions:  IF AM blood sugar is  >150, increase lantus by 1 unit at night.  Continue to check sugars daily. On day 3, if blood sugar >150, continue to increase lantus by 1 unit.  If blood sugar < 70s, than need to reduce dose by 1 unit nightly.  Continue to check blood sugars in am daily and adjust per above.  -- please call us if you have any questions     Influenza Virus Vaccine injection (Fluarix) What is this medicine? INFLUENZA VIRUS VACCINE (in floo EN zuh VAHY ruhs vak SEEN) helps to reduce the risk of getting influenza also known as the flu. This medicine may be used for other purposes; ask your health care provider or pharmacist if you have questions. COMMON BRAND NAME(S): Fluarix, Fluzone What should I tell my health care provider before I take this medicine? They need to know if you have any of these conditions: -bleeding disorder like hemophilia -fever or infection -Guillain-Barre syndrome or other neurological problems -immune system problems -infection with the human immunodeficiency virus (HIV) or AIDS -low blood platelet counts -multiple sclerosis -an unusual or allergic reaction to influenza virus vaccine, eggs, chicken proteins, latex, gentamicin, other medicines, foods, dyes or preservatives -pregnant or trying to get pregnant -breast-feeding How should I use this medicine? This vaccine is for injection into a muscle. It is given by a health care professional. A copy of Vaccine Information Statements will be  given before each vaccination. Read this sheet carefully each time. The sheet may change frequently. Talk to your pediatrician regarding the use of this medicine in children. Special care may be needed. Overdosage: If you think you have taken too much of this medicine contact a poison control center or emergency room at once. NOTE: This medicine is only for you. Do not share this medicine with others. What if I miss a dose? This does not apply. What may interact with this medicine? -chemotherapy or radiation therapy -medicines that lower your immune system like etanercept, anakinra, infliximab, and adalimumab -medicines that treat or prevent blood clots like warfarin -phenytoin -steroid medicines like prednisone or cortisone -theophylline -vaccines This list may not describe all possible interactions. Give your health care provider a list of all the medicines, herbs, non-prescription drugs, or dietary supplements you use. Also tell them if you smoke, drink alcohol, or use illegal drugs. Some items may interact with your medicine. What should I watch for while using this medicine? Report any side effects that do not go away within 3 days to your doctor or health care professional. Call your health care provider if any unusual symptoms occur within 6 weeks of receiving this vaccine. You may still catch the flu, but the illness is not usually as bad. You cannot get the flu from the vaccine. The vaccine will not protect against colds or other illnesses that may cause fever. The vaccine is needed every year. What side effects may I notice from receiving this medicine? Side  effects that you should report to your doctor or health care professional as soon as possible: -allergic reactions like skin rash, itching or hives, swelling of the face, lips, or tongue Side effects that usually do not require medical attention (report to your doctor or health care professional if they continue or are  bothersome): -fever -headache -muscle aches and pains -pain, tenderness, redness, or swelling at site where injected -weak or tired This list may not describe all possible side effects. Call your doctor for medical advice about side effects. You may report side effects to FDA at 1-800-FDA-1088. Where should I keep my medicine? This vaccine is only given in a clinic, pharmacy, doctor's office, or other health care setting and will not be stored at home. NOTE: This sheet is a summary. It may not cover all possible information. If you have questions about this medicine, talk to your doctor, pharmacist, or health care provider.  2017 Elsevier/Gold Standard (2008-04-02 09:30:40)   -  Diabetes Mellitus and Food It is important for you to manage your blood sugar (glucose) level. Your blood glucose level can be greatly affected by what you eat. Eating healthier foods in the appropriate amounts throughout the day at about the same time each day will help you control your blood glucose level. It can also help slow or prevent worsening of your diabetes mellitus. Healthy eating may even help you improve the level of your blood pressure and reach or maintain a healthy weight. General recommendations for healthful eating and cooking habits include:  Eating meals and snacks regularly. Avoid going long periods of time without eating to lose weight.  Eating a diet that consists mainly of plant-based foods, such as fruits, vegetables, nuts, legumes, and whole grains.  Using low-heat cooking methods, such as baking, instead of high-heat cooking methods, such as deep frying. Work with your dietitian to make sure you understand how to use the Nutrition Facts information on food labels. How can food affect me? Carbohydrates  Carbohydrates affect your blood glucose level more than any other type of food. Your dietitian will help you determine how many carbohydrates to eat at each meal and teach you how to count  carbohydrates. Counting carbohydrates is important to keep your blood glucose at a healthy level, especially if you are using insulin or taking certain medicines for diabetes mellitus. Alcohol  Alcohol can cause sudden decreases in blood glucose (hypoglycemia), especially if you use insulin or take certain medicines for diabetes mellitus. Hypoglycemia can be a life-threatening condition. Symptoms of hypoglycemia (sleepiness, dizziness, and disorientation) are similar to symptoms of having too much alcohol. If your health care provider has given you approval to drink alcohol, do so in moderation and use the following guidelines:  Women should not have more than one drink per day, and men should not have more than two drinks per day. One drink is equal to:  12 oz of beer.  5 oz of wine.  1 oz of hard liquor.  Do not drink on an empty stomach.  Keep yourself hydrated. Have water, diet soda, or unsweetened iced tea.  Regular soda, juice, and other mixers might contain a lot of carbohydrates and should be counted. What foods are not recommended? As you make food choices, it is important to remember that all foods are not the same. Some foods have fewer nutrients per serving than other foods, even though they might have the same number of calories or carbohydrates. It is difficult to get your body what  it needs when you eat foods with fewer nutrients. Examples of foods that you should avoid that are high in calories and carbohydrates but low in nutrients include:  Trans fats (most processed foods list trans fats on the Nutrition Facts label).  Regular soda.  Juice.  Candy.  Sweets, such as cake, pie, doughnuts, and cookies.  Fried foods. What foods can I eat? Eat nutrient-rich foods, which will nourish your body and keep you healthy. The food you should eat also will depend on several factors, including:  The calories you need.  The medicines you take.  Your weight.  Your blood  glucose level.  Your blood pressure level.  Your cholesterol level. You should eat a variety of foods, including:  Protein.  Lean cuts of meat.  Proteins low in saturated fats, such as fish, egg whites, and beans. Avoid processed meats.  Fruits and vegetables.  Fruits and vegetables that may help control blood glucose levels, such as apples, mangoes, and yams.  Dairy products.  Choose fat-free or low-fat dairy products, such as milk, yogurt, and cheese.  Grains, bread, pasta, and rice.  Choose whole grain products, such as multigrain bread, whole oats, and brown rice. These foods may help control blood pressure.  Fats.  Foods containing healthful fats, such as nuts, avocado, olive oil, canola oil, and fish. Does everyone with diabetes mellitus have the same meal plan? Because every person with diabetes mellitus is different, there is not one meal plan that works for everyone. It is very important that you meet with a dietitian who will help you create a meal plan that is just right for you. This information is not intended to replace advice given to you by your health care provider. Make sure you discuss any questions you have with your health care provider. Document Released: 06/02/2005 Document Revised: 02/11/2016 Document Reviewed: 08/02/2013 Elsevier Interactive Patient Education  2017 Elsevier Inc.  -  Diabetes Mellitus and Exercise Exercising regularly is important for your overall health, especially when you have diabetes (diabetes mellitus). Exercising is not only about losing weight. It has many health benefits, such as increasing muscle strength and bone density and reducing body fat and stress. This leads to improved fitness, flexibility, and endurance, all of which result in better overall health. Exercise has additional benefits for people with diabetes, including:  Reducing appetite.  Helping to lower and control blood glucose.  Lowering blood  pressure.  Helping to control amounts of fatty substances (lipids) in the blood, such as cholesterol and triglycerides.  Helping the body to respond better to insulin (improving insulin sensitivity).  Reducing how much insulin the body needs.  Decreasing the risk for heart disease by:  Lowering cholesterol and triglyceride levels.  Increasing the levels of good cholesterol.  Lowering blood glucose levels. What is my activity plan? Your health care provider or certified diabetes educator can help you make a plan for the type and frequency of exercise (activity plan) that works for you. Make sure that you:  Do at least 150 minutes of moderate-intensity or vigorous-intensity exercise each week. This could be brisk walking, biking, or water aerobics.  Do stretching and strength exercises, such as yoga or weightlifting, at least 2 times a week.  Spread out your activity over at least 3 days of the week.  Get some form of physical activity every day.  Do not go more than 2 days in a row without some kind of physical activity.  Avoid being inactive for more  than 90 minutes at a time. Take frequent breaks to walk or stretch.  Choose a type of exercise or activity that you enjoy, and set realistic goals.  Start slowly, and gradually increase the intensity of your exercise over time. What do I need to know about managing my diabetes?  Check your blood glucose before and after exercising.  If your blood glucose is higher than 240 mg/dL (16.1 mmol/L) before you exercise, check your urine for ketones. If you have ketones in your urine, do not exercise until your blood glucose returns to normal.  Know the symptoms of low blood glucose (hypoglycemia) and how to treat it. Your risk for hypoglycemia increases during and after exercise. Common symptoms of hypoglycemia can include:  Hunger.  Anxiety.  Sweating and feeling clammy.  Confusion.  Dizziness or feeling  light-headed.  Increased heart rate or palpitations.  Blurry vision.  Tingling or numbness around the mouth, lips, or tongue.  Tremors or shakes.  Irritability.  Keep a rapid-acting carbohydrate snack available before, during, and after exercise to help prevent or treat hypoglycemia.  Avoid injecting insulin into areas of the body that are going to be exercised. For example, avoid injecting insulin into:  The arms, when playing tennis.  The legs, when jogging.  Keep records of your exercise habits. Doing this can help you and your health care provider adjust your diabetes management plan as needed. Write down:  Food that you eat before and after you exercise.  Blood glucose levels before and after you exercise.  The type and amount of exercise you have done.  When your insulin is expected to peak, if you use insulin. Avoid exercising at times when your insulin is peaking.  When you start a new exercise or activity, work with your health care provider to make sure the activity is safe for you, and to adjust your insulin, medicines, or food intake as needed.  Drink plenty of water while you exercise to prevent dehydration or heat stroke. Drink enough fluid to keep your urine clear or pale yellow. This information is not intended to replace advice given to you by your health care provider. Make sure you discuss any questions you have with your health care provider. Document Released: 11/26/2003 Document Revised: 03/25/2016 Document Reviewed: 02/15/2016 Elsevier Interactive Patient Education  2017 Elsevier Inc.   -  Low-Sodium Eating Plan Sodium raises blood pressure and causes water to be held in the body. Getting less sodium from food will help lower your blood pressure, reduce any swelling, and protect your heart, liver, and kidneys. We get sodium by adding salt (sodium chloride) to food. Most of our sodium comes from canned, boxed, and frozen foods. Restaurant foods, fast  foods, and pizza are also very high in sodium. Even if you take medicine to lower your blood pressure or to reduce fluid in your body, getting less sodium from your food is important. What is my plan? Most people should limit their sodium intake to 2,300 mg a day. Your health care provider recommends that you limit your sodium intake to 2,000mg  a day. What do I need to know about this eating plan? For the low-sodium eating plan, you will follow these general guidelines:  Choose foods with a % Daily Value for sodium of less than 5% (as listed on the food label).  Use salt-free seasonings or herbs instead of table salt or sea salt.  Check with your health care provider or pharmacist before using salt substitutes.  Eat  fresh foods.  Eat more vegetables and fruits.  Limit canned vegetables. If you do use them, rinse them well to decrease the sodium.  Limit cheese to 1 oz (28 g) per day.  Eat lower-sodium products, often labeled as "lower sodium" or "no salt added."  Avoid foods that contain monosodium glutamate (MSG). MSG is sometimes added to [place] food and some canned foods.  Check food labels (Nutrition Facts labels) on foods to learn how much sodium is in one serving.  Eat more home-cooked food and less restaurant, buffet, and fast food.  When eating at a restaurant, ask that your food be prepared with less salt, or no salt if possible. How do I read food labels for sodium information? The Nutrition Facts label lists the amount of sodium in one serving of the food. If you eat more than one serving, you must multiply the listed amount of sodium by the number of servings. Food labels may also identify foods as:  Sodium free-Less than 5 mg in a serving.  Very low sodium-35 mg or less in a serving.  Low sodium-140 mg or less in a serving.  Light in sodium-50% less sodium in a serving. For example, if a food that usually has 300 mg of sodium is changed to become light in sodium,  it will have 150 mg of sodium.  Reduced sodium-25% less sodium in a serving. For example, if a food that usually has 400 mg of sodium is changed to reduced sodium, it will have 300 mg of sodium. What foods can I eat? Grains  Low-sodium cereals, including oats, puffed wheat and rice, and shredded wheat cereals. Low-sodium crackers. Unsalted rice and pasta. Lower-sodium bread. Vegetables  Frozen or fresh vegetables. Low-sodium or reduced-sodium canned vegetables. Low-sodium or reduced-sodium tomato sauce and paste. Low-sodium or reduced-sodium tomato and vegetable juices. Fruits  Fresh, frozen, and canned fruit. Fruit juice. Meat and Other Protein Products  Low-sodium canned tuna and salmon. Fresh or frozen meat, poultry, seafood, and fish. Lamb. Unsalted nuts. Dried beans, peas, and lentils without added salt. Unsalted canned beans. Homemade soups without salt. Eggs. Dairy  Milk. Soy milk. Ricotta cheese. Low-sodium or reduced-sodium cheeses. Yogurt. Condiments  Fresh and dried herbs and spices. Salt-free seasonings. Onion and garlic powders. Low-sodium varieties of mustard and ketchup. Fresh or refrigerated horseradish. Lemon juice. Fats and Oils  Reduced-sodium salad dressings. Unsalted butter. Other  Unsalted popcorn and pretzels. The items listed above may not be a complete list of recommended foods or beverages. Contact your dietitian for more options.  What foods are not recommended? Grains  Instant hot cereals. Bread stuffing, pancake, and biscuit mixes. Croutons. Seasoned rice or pasta mixes. Noodle soup cups. Boxed or frozen macaroni and cheese. Self-rising flour. Regular salted crackers. Vegetables  Regular canned vegetables. Regular canned tomato sauce and paste. Regular tomato and vegetable juices. Frozen vegetables in sauces. Salted [place] fries. Olives. Rosita Fire. Relishes. Sauerkraut. Salsa. Meat and Other Protein Products  Salted, canned, smoked, spiced, or pickled meats,  seafood, or fish. Bacon, ham, sausage, hot dogs, corned beef, chipped beef, and packaged luncheon meats. Salt pork. Jerky. Pickled herring. Anchovies, regular canned tuna, and sardines. Salted nuts. Dairy  Processed cheese and cheese spreads. Cheese curds. Blue cheese and cottage cheese. Buttermilk. Condiments  Onion and garlic salt, seasoned salt, table salt, and sea salt. Canned and packaged gravies. Worcestershire sauce. Tartar sauce. Barbecue sauce. Teriyaki sauce. Soy sauce, including reduced sodium. Steak sauce. Fish sauce. Oyster sauce. Cocktail sauce. Horseradish that you  find on the shelf. Regular ketchup and mustard. Meat flavorings and tenderizers. Bouillon cubes. Hot sauce. Tabasco sauce. Marinades. Taco seasonings. Relishes. Fats and Oils  Regular salad dressings. Salted butter. Margarine. Ghee. Bacon fat. Other  Potato and tortilla chips. Corn chips and puffs. Salted popcorn and pretzels. Canned or dried soups. Pizza. Frozen entrees and pot pies. The items listed above may not be a complete list of foods and beverages to avoid. Contact your dietitian for more information.  This information is not intended to replace advice given to you by your health care provider. Make sure you discuss any questions you have with your health care provider. Document Released: 02/25/2002 Document Revised: 02/11/2016 Document Reviewed: 07/10/2013 Elsevier Interactive Patient Education  2017 Elsevier Inc.  -  Hypertension Hypertension is another name for high blood pressure. High blood pressure forces your heart to work harder to pump blood. A blood pressure reading has two numbers, which includes a higher number over a lower number (example: 110/72). Follow these instructions at home:  Have your blood pressure rechecked by your doctor.  Only take medicine as told by your doctor. Follow the directions carefully. The medicine does not work as well if you skip doses. Skipping doses also puts you at  risk for problems.  Do not smoke.  Monitor your blood pressure at home as told by your doctor. Contact a doctor if:  You think you are having a reaction to the medicine you are taking.  You have repeat headaches or feel dizzy.  You have puffiness (swelling) in your ankles.  You have trouble with your vision. Get help right away if:  You get a very bad headache and are confused.  You feel weak, numb, or faint.  You get chest or belly (abdominal) pain.  You throw up (vomit).  You cannot breathe very well. This information is not intended to replace advice given to you by your health care provider. Make sure you discuss any questions you have with your health care provider. Document Released: 02/22/2008 Document Revised: 02/11/2016 Document Reviewed: 06/28/2013 Elsevier Interactive Patient Education  2017 Elsevier Inc.   - Generalized Anxiety Disorder Generalized anxiety disorder (GAD) is a mental disorder. It interferes with life functions, including relationships, work, and school. GAD is different from normal anxiety, which everyone experiences at some point in their lives in response to specific life events and activities. Normal anxiety actually helps [place] prepare for and get through these life events and activities. Normal anxiety goes away after the event or activity is over.  GAD causes anxiety that is not necessarily related to specific events or activities. It also causes excess anxiety in proportion to specific events or activities. The anxiety associated with GAD is also difficult to control. GAD can vary from mild to severe. People with severe GAD can have intense waves of anxiety with physical symptoms (panic attacks).  SYMPTOMS The anxiety and worry associated with GAD are difficult to control. This anxiety and worry are related to many life events and activities and also occur more days than not for 6 months or longer. People with GAD also have three or more of the  following symptoms (one or more in children):  Restlessness.   Fatigue.  Difficulty concentrating.   Irritability.  Muscle tension.  Difficulty sleeping or unsatisfying sleep. DIAGNOSIS GAD is diagnosed through an assessment by your health care provider. Your health care provider will ask you questions aboutyour mood,physical symptoms, and events in your life. Your health care provider  may ask you about your medical history and use of alcohol or drugs, including prescription medicines. Your health care provider may also do a physical exam and blood tests. Certain medical conditions and the use of certain substances can cause symptoms similar to those associated with GAD. Your health care provider may refer you to a mental health specialist for further evaluation. TREATMENT The following therapies are usually used to treat GAD:   Medication. Antidepressant medication usually is prescribed for long-term daily control. Antianxiety medicines may be added in severe cases, especially when panic attacks occur.   Talk therapy (psychotherapy). Certain types of talk therapy can be helpful in treating GAD by providing support, education, and guidance. A form of talk therapy called cognitive behavioral therapy can teach you healthy ways to think about and react to daily life events and activities.  Stress managementtechniques. These include yoga, meditation, and exercise and can be very helpful when they are practiced regularly. A mental health specialist can help determine which treatment is best for you. Some people see improvement with one therapy. However, other people require a combination of therapies. This information is not intended to replace advice given to you by your health care provider. Make sure you discuss any questions you have with your health care provider. Document Released: 12/31/2012 Document Revised: 09/26/2014 Document Reviewed: 12/31/2012 Elsevier Interactive Patient  Education  2017 [employer].   -

## 2016-08-17 NOTE — BH Specialist Note (Signed)
Session Start time: 9:45 am   End Time: 10:15 am Total Time:  30 minutes Type of Service: Behavioral Health - Individual/Family Interpreter: No.   Interpreter Name & Language: N/A # [facility] July 2017-June 2018: 1st   SUBJECTIVE: Nicole Yates is a 60 y.o. female  Pt. was referred by Dr. Julien NordmannLangeland for:  anxiety and depression. Pt. reports the following symptoms/concerns: difficulty staying asleep, racing thoughts, excessive tremors Duration of problem:  Pt was diagnosed with Anxiety approximately 15 years ago.  Severity: moderate Previous treatment: Pt participated in medication management in the past. Willing to try different medication to address anxiety   OBJECTIVE: Mood: Anxious & Affect: Appropriate and Tearful Risk of harm to self or others: Pt denied SI/HI Assessments administered: PHQ-9; GAD-7  LIFE CONTEXT:  Family & Social: Pt resides with husband. 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 Pt denied substance use. Life changes: Pt has financial stressors What is important to pt/family (values): Family   GOALS ADDRESSED:  Decrease symptoms of depression Decrease symptoms of anxiety  INTERVENTIONS: Solution Focused, Strength-based and Supportive   ASSESSMENT:  Pt currently experiencing depression and anxiety triggered by financial stressors. Pt reports difficulty staying asleep, racing thoughts, and excessive tremors.  Pt may benefit from psycho education, psychotherapy, and medication management. LCSWA provided education on anxiety and depression. LCSWA discussed benefits of applyig healthy coping skills to decrease symptoms and pt identified multiple coping strategies to utilize on a daily basis. Pt was provided community resources for crisis intervention, therapy,  and medication management.     PLAN: 1. F/U with behavioral health clinician: Pt was encouraged to contact LCSWA if symptoms worsen or fail to improve to [facility]. 2. Behavioral Health meds: Pt has been prescribed Lexapro 3. Behavioral recommendations: LCSWA recommends that pt apply healthy coping skills discussed. Pt is encouraged to schedule follow up appointment with LCSWA 4. Referral: Brief Counseling/Psychotherapy, [facility], Problem-solving teaching/coping strategies, Psychoeducation and Supportive Counseling 5. From scale of 1-10, how likely are you to follow plan: 8/10   Bridgett LarssonJasmine D Killian Ress, MSW, LCSWA  Clinical Social Work  [employer] FoodsWarmhandoff:   Warm Hand Off Completed.

## 2016-08-17 NOTE — Progress Notes (Signed)
Pt is in the office today for diabetes and establish care Pt states her pain level today in the office is a 1 Pt states her pain is coming from her sciatica

## 2016-08-17 NOTE — Progress Notes (Signed)
Nicole Yates, is a 60 y.o. female  [id number]:[id number]  [id number]  DOB - [birth year]  CC:  Chief Complaint  Patient presents with  . Diabetes       HPI: Nicole Yates is a 60 y.o. female here today to establish medical care, last seen in clinic 1/17, for htn/dm/hld.  Pt states last year has been very stressful and she has not been taking care of herself well.  She does not smoke, remote hx of tob x 40pack year. Taking "Lemimir" 10 units qam, although in computer is ordered as lantus.  And taking novolog 3units w/ meals. C/o of mild indigestion w/ metformin, but no diarrhea.  Very anxious, stressed out. No si/hi/avh.  Use to be on xanex, but not taking anything now.  Has had to use her ventolin inhaler more last few days, but denies f/c/producitve cough.   Her arthritis has been acting up as well with weather, hands/back, and asked for something mild to take occasionally as well. Pt did not realize that Ultram was a narcotic.  Patient has No headache, No chest pain, No abdominal pain - No Nausea, No new weakness tingling or numbness, No Cough - SOB.    Review of Systems:  per hpi, o/w all systems reviewed and negative. Allergies  Allergen Reactions  . Codeine Other (See Comments)    Childhood allergy, couldn't walk, talk  . Tetanus Toxoids Swelling   Past Medical History:  Diagnosis Date  . Anxiety   . Diabetes mellitus without complication ([place])   . Hypertension    Current Outpatient Prescriptions on File Prior to Visit  Medication Sig Dispense Refill  . azithromycin (ZITHROMAX) 250 MG tablet Take 2 today and 1 pill each day after until completed (Patient not taking: Reported on 08/17/2016) 6 tablet 0  . Blood Glucose Monitoring Suppl (TRUE METRIX METER) W/DEVICE KIT 1 each by Does not apply route 4 (four) times daily -  before meals and at bedtime. 1 kit 0  . diclofenac (CATAFLAM) 50 MG tablet Take 1 tablet (50 mg total) by mouth 3 (three) times daily. (Patient not  taking: Reported on 08/17/2016) 60 tablet 3  . furosemide (LASIX) 20 MG tablet Take 1 tablet (20 mg total) by mouth daily. (Patient not taking: Reported on 08/17/2016) 3 tablet 0  . Insulin Syringe-Needle U-100 30G X 3/8" 0.3 ML MISC 1 Syringe by Does not apply route 4 (four) times daily. 120 each 1  . Multiple Vitamin (MULTIVITAMIN WITH MINERALS) TABS tablet Take 1 tablet by mouth daily.    . promethazine (PHENERGAN) 12.5 MG tablet Take 1 tablet (12.5 mg total) by mouth every 6 (six) hours as needed for nausea or vomiting. (Patient not taking: Reported on 08/17/2016) 30 tablet 0   No current facility-administered medications on file prior to visit.    Family History  Problem Relation Age of Onset  . Breast cancer Mother   . Cancer Mother     uterine and liver  . Diabetes Father   . Hypertension Brother    Social History   Social History  . Marital status: Married    Spouse name: N/A  . Number of children: N/A  . Years of education: N/A   Occupational History  . Not on file.   Social History Main Topics  . Smoking status: Former Research scientist (life sciences)  . Smokeless tobacco: Never Used  . Alcohol use Yes     Comment: social  . Drug use: No  . Sexual activity: Not Currently  Other Topics Concern  . Not on file   Social History Narrative  . No narrative on file    Objective:   Vitals:   08/17/16 0907  BP: (!) 183/80  Pulse: 76  Resp: 16  Temp: 97.9 F (36.6 C)    Filed Weights   08/17/16 0907  Weight: 132 lb 6.4 oz (60.1 kg)    BP Readings from Last 3 Encounters:  08/17/16 (!) 183/80  04/28/16 (!) 142/80  10/12/15 (!) 152/84    Physical Exam: Constitutional: Patient appears well-developed and well-nourished. No distress. AAOx3, pleasant, anxious, tearful at times. HENT: Normocephalic, atraumatic, External right and left ear normal. Oropharynx is clear and moist.  bilat tms clear Eyes: Conjunctivae and EOM are normal. PERRL, no scleral icterus. Neck: Normal ROM. Neck  supple. No JVD.  No thyromegaly. CVS: RRR, S1/S2 +, no murmurs, no gallops, no carotid bruit.  Pulmonary: Effort normal,  breath sounds course diffusely, no stridor, rhonchi, wheezes, rales.  Abdominal: Soft. BS +, no distension, tenderness, rebound or guarding.  Musculoskeletal: Normal range of motion. No edema and no tenderness.  LE: bilat/ no c/c/e, pulses 2+ bilateral. Neuro: Alert.  muscle tone coordination wnl. No cranial nerve deficit grossly. Skin: Skin is warm and dry. No rash noted. Not diaphoretic. No erythema. No pallor. Psychiatric: Normal mood and affect. Behavior, judgment, thought content normal.  Lab Results  Component Value Date   WBC 9.2 06/12/2014   HGB 14.0 06/12/2014   HCT 39.7 06/12/2014   MCV 83.6 06/12/2014   PLT 252 06/12/2014   Lab Results  Component Value Date   CREATININE 0.77 10/13/2015   BUN 13 10/13/2015   NA 139 10/13/2015   K 5.5 (H) 10/13/2015   CL 102 10/13/2015   CO2 28 10/13/2015    Lab Results  Component Value Date   HGBA1C 10.7 08/17/2016   Lipid Panel     Component Value Date/Time   CHOL 172 10/13/2015 0905   TRIG 56 10/13/2015 0905   HDL 83 10/13/2015 0905   CHOLHDL 2.1 10/13/2015 0905   VLDL 11 10/13/2015 0905   LDLCALC 78 10/13/2015 0905        Depression screen PHQ 2/9 08/17/2016 03/16/2015 07/03/2014  Decreased Interest 1 0 0  Down, Depressed, Hopeless 0 0 1  PHQ - 2 Score 1 0 1    Assessment and plan:   1. Type 2 diabetes mellitus without complication, with long-term current use of insulin (HCC) - out of control, discussed diet/exercise - increase lantus to 15 qhs, instructed pt on self-titration q3days for lantus. Pt was able to repeat instructions  On self titration, and understands what is needed for better control - POCT glucose (manual entry) 417 - POCT glycosylated hemoglobin (Hb A1C) 10.7 - insulin aspart (novoLOG) injection 20 Units; Inject 0.2 mLs (20 Units total) into the skin once. - POCT urinalysis  dipstick - Microalbumin/Creatinine Ratio, Urine - insulin aspart (NOVOLOG) 100 UNIT/ML injection; Inject 5 Units into the skin 3 (three) times daily with meals.  Dispense: 30 mL; Refill: 9 - POCT glucose (manual entry) - fu w/ [facility] in 2 wks for dm chk.  2. htn Uncontrolled, goal <130/80 - increased norvasc from 5 mg to 10 qd. - low salt diet recd, info provided.  3. Copd, no exacerbation - renewed ventolin prn,  - restarted symbicort, scheduled, sounds like she has been off of it for months if not longer. Pt states that was not continued on it when here last, but  not sure why.  4. Hyperlipidemia Renewed atorvastatin 20 qhs  5. gen anxiety d/o, w/ depression Denies si/hi/avh, lots of stressors at home - trial lexapro 10 qday, recd titrate up w/ 1/2 tab for 4 days until full dose - sw Jasmine saw pt today as well, appreciate assistance greatly.  6. Financial aid packet.  7. Flu vac today. No tpda/ allergy to tetanus toxoid per history. - already has pneumococcal 23v vac 10/15  Return in about 4 weeks (around 09/14/2016) for dm /anxiety.  The patient was given clear instructions to go to ER or return to medical center if symptoms don't improve, worsen or new problems develop. The patient verbalized understanding. The patient was told to call to get lab results if they haven't heard anything in the next week.    This note has been created with Surveyor, quantity. Any transcriptional errors are unintentional.   Maren Reamer, MD, [place], [place]   08/17/2016, 11:47 AM

## 2016-08-18 LAB — MICROALBUMIN / CREATININE URINE RATIO
CREATININE, URINE: 83 mg/dL (ref 20–320)
MICROALB UR: 0.8 mg/dL
Microalb Creat Ratio: 10 mcg/mg creat (ref <30)

## 2016-08-25 ENCOUNTER — Telehealth: Payer: Self-pay

## 2016-08-25 ENCOUNTER — Ambulatory Visit: Payer: Self-pay | Admitting: Pharmacist

## 2016-08-25 NOTE — Telephone Encounter (Signed)
Contacted pt to go over lab results pt didn't answer lvm asking pt to give me a call back at her earliest convenience  

## 2016-09-08 ENCOUNTER — Ambulatory Visit: Payer: Self-pay | Admitting: Pharmacist

## 2016-09-26 MED FILL — METFORMIN HCL ER 500 MG TAB: 500 | 30 days supply | Qty: 120 | Fill #1

## 2016-09-26 MED FILL — LANTUS SOLOSTAR 100 UNITS/M: 100 | 40 days supply | Qty: 6 | Fill #1

## 2016-09-26 MED FILL — TRUE METRIX TEST STRIP: 25 days supply | Qty: 100 | Fill #1

## 2016-09-26 MED FILL — ?AMLODIPINE BESYLATE 10 MG: 10 | 30 days supply | Qty: 30 | Fill #1

## 2016-09-26 MED FILL — ?ATORVASTATIN 20 MG TABLET: 20 | 30 days supply | Qty: 30 | Fill #1

## 2016-10-12 MED FILL — ULTICARE PEN NDL 4MM 32G: 32G X 4 MM | 30 days supply | Qty: 100 | Fill #1

## 2016-10-13 MED FILL — ?ESCITALPRAM 10 MG TABLET: 10 MG | 30 days supply | Qty: 30 | Fill #1

## 2016-10-13 MED FILL — MELOXICAM 7.5 MG TABLET: 7.5 | 30 days supply | Qty: 30 | Fill #1

## 2016-10-14 MED FILL — SYMBICORT 80-4.5 MCG INH: 80-4.5 | 30 days supply | Qty: 10 | Fill #1

## 2016-11-07 ENCOUNTER — Telehealth: Payer: Self-pay | Admitting: Internal Medicine

## 2016-11-07 DIAGNOSIS — Z794 Long term (current) use of insulin: Principal | ICD-10-CM

## 2016-11-07 DIAGNOSIS — E119 Type 2 diabetes mellitus without complications: Secondary | ICD-10-CM

## 2016-11-07 MED ORDER — METFORMIN HCL ER 500 MG PO TB24: 1000.0000 mg | ORAL_TABLET | Freq: Two times a day (BID) | ORAL | 0 refills | Status: DC

## 2016-11-07 MED ORDER — AMLODIPINE BESYLATE 10 MG PO TABS: 10.0000 mg | ORAL_TABLET | Freq: Every day | ORAL | 0 refills | Status: DC

## 2016-11-07 MED ORDER — INSULIN GLARGINE 100 UNIT/ML SOLOSTAR PEN: 15.0000 [IU] | PEN_INJECTOR | Freq: Every day | SUBCUTANEOUS | 0 refills | Status: DC

## 2016-11-07 MED ORDER — ATORVASTATIN CALCIUM 20 MG PO TABS: 20.0000 mg | ORAL_TABLET | Freq: Every day | ORAL | 0 refills | Status: DC

## 2016-11-07 MED ORDER — INSULIN ASPART 100 UNIT/ML ~~LOC~~ SOLN: 5.0000 [IU] | Freq: Three times a day (TID) | SUBCUTANEOUS | 0 refills | Status: DC

## 2016-11-07 MED ORDER — GLUCOSE BLOOD VI STRP: ORAL_STRIP | 12 refills | Status: DC

## 2016-11-07 MED FILL — ATORVASTATIN 20 MG TABLET: 20 | 30 days supply | Qty: 30 | Fill #0

## 2016-11-07 MED FILL — !NOVOLOG 100UNITS/ML VIAL: 100/ML | 30 days supply | Qty: 10 | Fill #0

## 2016-11-07 MED FILL — AMLODIPINE BESYLATE 10 MG T: 10 | 30 days supply | Qty: 30 | Fill #0

## 2016-11-07 MED FILL — METFORMIN HCL ER 500 MG TAB: 500 | 30 days supply | Qty: 120 | Fill #0

## 2016-11-07 MED FILL — !LANTUS SOLOSTAR 100UNITS/M: 100 | 19 days supply | Qty: 3 | Fill #0

## 2016-11-07 MED FILL — TRUE METRIX TEST STRIP: 25 days supply | Qty: 100 | Fill #0

## 2016-11-07 NOTE — Telephone Encounter (Signed)
Requested medications refilled x 30 days - patient due for office visit and can get further refills at that time

## 2016-11-07 NOTE — Telephone Encounter (Signed)
Pt called requesting medication refill on  amLODipine (NORVASC) 10 MG tablet,atorvastatin (LIPITOR) 20 MG tablet,glucose blood (TRUE METRIX BLOOD GLUCOSE TEST) test strip, metFORMIN (GLUCOPHAGE XR) 500 MG 24 hr tablet and insulin. Please f/up

## 2016-12-14 ENCOUNTER — Encounter: Payer: Self-pay | Admitting: Internal Medicine

## 2016-12-14 ENCOUNTER — Ambulatory Visit: Payer: Self-pay | Attending: Internal Medicine | Admitting: Internal Medicine

## 2016-12-14 VITALS — BP 167/78 | HR 82 | Temp 98.3°F | Resp 16 | Wt 130.8 lb

## 2016-12-14 DIAGNOSIS — Z7982 Long term (current) use of aspirin: Secondary | ICD-10-CM | POA: Insufficient documentation

## 2016-12-14 DIAGNOSIS — E119 Type 2 diabetes mellitus without complications: Secondary | ICD-10-CM

## 2016-12-14 DIAGNOSIS — I10 Essential (primary) hypertension: Secondary | ICD-10-CM | POA: Insufficient documentation

## 2016-12-14 DIAGNOSIS — F419 Anxiety disorder, unspecified: Secondary | ICD-10-CM | POA: Insufficient documentation

## 2016-12-14 DIAGNOSIS — J449 Chronic obstructive pulmonary disease, unspecified: Secondary | ICD-10-CM | POA: Insufficient documentation

## 2016-12-14 DIAGNOSIS — Z1159 Encounter for screening for other viral diseases: Secondary | ICD-10-CM

## 2016-12-14 DIAGNOSIS — Z1329 Encounter for screening for other suspected endocrine disorder: Secondary | ICD-10-CM

## 2016-12-14 DIAGNOSIS — E1165 Type 2 diabetes mellitus with hyperglycemia: Secondary | ICD-10-CM | POA: Insufficient documentation

## 2016-12-14 DIAGNOSIS — Z794 Long term (current) use of insulin: Secondary | ICD-10-CM | POA: Insufficient documentation

## 2016-12-14 DIAGNOSIS — E559 Vitamin D deficiency, unspecified: Secondary | ICD-10-CM | POA: Insufficient documentation

## 2016-12-14 DIAGNOSIS — Z114 Encounter for screening for human immunodeficiency virus [HIV]: Secondary | ICD-10-CM

## 2016-12-14 DIAGNOSIS — E785 Hyperlipidemia, unspecified: Secondary | ICD-10-CM | POA: Insufficient documentation

## 2016-12-14 LAB — GLUCOSE, POCT (MANUAL RESULT ENTRY): POC Glucose: 118 mg/dl — AB (ref 70–99)

## 2016-12-14 LAB — POCT GLYCOSYLATED HEMOGLOBIN (HGB A1C): Hemoglobin A1C: 8.4

## 2016-12-14 MED ORDER — METFORMIN HCL ER 500 MG PO TB24: 1000.0000 mg | ORAL_TABLET | Freq: Two times a day (BID) | ORAL | 2 refills | Status: DC

## 2016-12-14 MED ORDER — MELOXICAM 7.5 MG PO TABS: 7.5000 mg | ORAL_TABLET | Freq: Every day | ORAL | 3 refills | Status: DC | PRN

## 2016-12-14 MED ORDER — INSULIN ASPART 100 UNIT/ML ~~LOC~~ SOLN: 3.0000 [IU] | Freq: Three times a day (TID) | SUBCUTANEOUS | 3 refills | Status: DC

## 2016-12-14 MED ORDER — LISINOPRIL-HYDROCHLOROTHIAZIDE 10-12.5 MG PO TABS: 1.0000 | ORAL_TABLET | Freq: Every day | ORAL | 3 refills | Status: DC

## 2016-12-14 MED ORDER — INSULIN GLARGINE 100 UNIT/ML SOLOSTAR PEN: 8.0000 [IU] | PEN_INJECTOR | Freq: Every day | SUBCUTANEOUS | 3 refills | Status: DC

## 2016-12-14 MED ORDER — ASPIRIN 81 MG PO TABS: 81.0000 mg | ORAL_TABLET | Freq: Every day | ORAL | 3 refills | Status: DC

## 2016-12-14 MED ORDER — AMLODIPINE BESYLATE 10 MG PO TABS: 10.0000 mg | ORAL_TABLET | Freq: Every day | ORAL | 2 refills | Status: DC

## 2016-12-14 MED ORDER — ESCITALOPRAM OXALATE 10 MG PO TABS: 10.0000 mg | ORAL_TABLET | Freq: Every day | ORAL | 3 refills | Status: DC

## 2016-12-14 MED ORDER — ATORVASTATIN CALCIUM 20 MG PO TABS: 20.0000 mg | ORAL_TABLET | Freq: Every day | ORAL | 3 refills | Status: DC

## 2016-12-14 MED ORDER — ALBUTEROL SULFATE HFA 108 (90 BASE) MCG/ACT IN AERS
2.0000 | INHALATION_SPRAY | RESPIRATORY_TRACT | 11 refills | Status: DC | PRN
Start: 2016-12-14 — End: 2017-03-27

## 2016-12-14 MED FILL — !VENTOLIN HFA INHALER: 108 (90 BAS | 16 days supply | Qty: 18 | Fill #0

## 2016-12-14 MED FILL — ?ESCITALOPRAM 10 MG TABLET: 10 | 30 days supply | Qty: 30 | Fill #0

## 2016-12-14 MED FILL — MELOXICAM 7.5 MG TABLET: 7.5 | 30 days supply | Qty: 30 | Fill #0

## 2016-12-14 MED FILL — METFORMIN HCL ER 500 MG TAB: 500 | 30 days supply | Qty: 120 | Fill #0

## 2016-12-14 MED FILL — ?ATORVASTATIN 20 MG TABLET: 20 | 30 days supply | Qty: 30 | Fill #0

## 2016-12-14 MED FILL — AMLODIPINE BESYLATE 10 MG T: 10 | 30 days supply | Qty: 30 | Fill #0

## 2016-12-14 MED FILL — LISINOPRIL-HCTZ 10-12.5 MG: 10-12.5 | 30 days supply | Qty: 30 | Fill #0

## 2016-12-14 NOTE — Progress Notes (Signed)
Nicole Yates, is a 61 y.o. female  [id number]  [id number]  DOB - Apr 08, 1956  Chief Complaint  Patient presents with  . Diabetes        Subjective:   Nicole Yates is a 61 y.o. female here today for a follow up visit, last seen 08/17/16, w/ hx of htn and dm, hx of well controlled copd .  She states she has been doing very well on her insulin and diet, watching cbgs much more closely. Typically am cbg raning 80-115s, had 1 incident cbg was 64, asx, but drank some juice. She is doing q3d titration of lantus, and now on 8units qhs. Still taking metformin, asa/statin. On novolog 3 units w/ meals if she is eating bigger meal.    May be less cautious on her salt intake due to processed foods.  Quit smoking years ago, rare etoh.  Patient has No headache, No chest pain, No abdominal pain - No Nausea, No new weakness tingling or numbness, No Cough - SOB.  No problems updated.  ALLERGIES: Allergies  Allergen Reactions  . Codeine Other (See Comments)    Childhood allergy, couldn't walk, talk  . Tetanus Toxoids Swelling    PAST MEDICAL HISTORY: Past Medical History:  Diagnosis Date  . Anxiety   . Diabetes mellitus without complication ([place])   . Hypertension     MEDICATIONS AT HOME: Prior to Admission medications   Medication Sig Start Date End Date Taking? Authorizing Provider  albuterol (VENTOLIN HFA) 108 (90 Base) MCG/ACT inhaler Inhale 2 puffs into the lungs every 4 (four) hours as needed for wheezing or shortness of breath. 08/17/16   Maren Reamer, MD  amLODipine (NORVASC) 10 MG tablet Take 1 tablet (10 mg total) by mouth daily. 12/14/16   Maren Reamer, MD  aspirin 81 MG tablet Take 1 tablet (81 mg total) by mouth daily. 12/14/16   Maren Reamer, MD  atorvastatin (LIPITOR) 20 MG tablet Take 1 tablet (20 mg total) by mouth daily. 12/14/16   Maren Reamer, MD  azithromycin (ZITHROMAX) 250 MG tablet Take 2 today and 1 pill each day after until  completed Patient not taking: Reported on 08/17/2016 03/16/15   Lance Bosch, NP  Blood Glucose Monitoring Suppl (TRUE METRIX METER) W/DEVICE KIT 1 each by Does not apply route 4 (four) times daily -  before meals and at bedtime. 07/22/15   Lance Bosch, NP  budesonide-formoterol (SYMBICORT) 80-4.5 MCG/ACT inhaler Inhale 2 puffs into the lungs 2 (two) times daily. 08/17/16   Maren Reamer, MD  diclofenac (CATAFLAM) 50 MG tablet Take 1 tablet (50 mg total) by mouth 3 (three) times daily. Patient not taking: Reported on 08/17/2016 08/26/14   Lance Bosch, NP  escitalopram (LEXAPRO) 10 MG tablet Take 1 tablet (10 mg total) by mouth daily. 12/14/16   Maren Reamer, MD  furosemide (LASIX) 20 MG tablet Take 1 tablet (20 mg total) by mouth daily. Patient not taking: Reported on 08/17/2016 10/14/15   Lance Bosch, NP  glucose blood (TRUE METRIX BLOOD GLUCOSE TEST) test strip Use as instructed 11/07/16   Maren Reamer, MD  insulin aspart (NOVOLOG) 100 UNIT/ML injection Inject 3 Units into the skin 3 (three) times daily with meals. 12/14/16   Maren Reamer, MD  Insulin Glargine (LANTUS SOLOSTAR) 100 UNIT/ML Solostar Pen Inject 8 Units into the skin daily at 10 pm. 12/14/16   Maren Reamer, MD  Insulin Pen Needle Flossie Buffy MICRO PEN  NEEDLES) 32G X 4 MM MISC 1 applicator by Does not apply route at bedtime. 08/17/16   Maren Reamer, MD  Insulin Syringe-Needle U-100 30G X 3/8" 0.3 ML MISC 1 Syringe by Does not apply route 4 (four) times daily. 06/12/14   Costin Karlyne Greenspan, MD  lisinopril-hydrochlorothiazide (PRINZIDE,ZESTORETIC) 10-12.5 MG tablet Take 1 tablet by mouth daily. 12/14/16   Maren Reamer, MD  meloxicam (MOBIC) 7.5 MG tablet Take 1 tablet (7.5 mg total) by mouth daily as needed for pain. Take w/ food. 12/14/16   Maren Reamer, MD  metFORMIN (GLUCOPHAGE XR) 500 MG 24 hr tablet Take 2 tablets (1,000 mg total) by mouth 2 (two) times daily. 12/14/16   Maren Reamer, MD  Multiple  Vitamin (MULTIVITAMIN WITH MINERALS) TABS tablet Take 1 tablet by mouth daily.    Historical Provider, MD  promethazine (PHENERGAN) 12.5 MG tablet Take 1 tablet (12.5 mg total) by mouth every 6 (six) hours as needed for nausea or vomiting. Patient not taking: Reported on 08/17/2016 11/12/14   Lance Bosch, NP  TRUEPLUS LANCETS 28G MISC 1 each by Does not apply route 4 (four) times daily -  before meals and at bedtime. 08/17/16   Maren Reamer, MD     Objective:   Vitals:   12/14/16 0831  BP: (!) 167/78  Pulse: 82  Resp: 16  Temp: 98.3 F (36.8 C)  TempSrc: Oral  SpO2: 97%  Weight: 130 lb 12.8 oz (59.3 kg)    Exam General appearance : Awake, alert, not in any distress. Speech Clear. Not toxic looking, pleasant. HEENT: Atraumatic and Normocephalic, pupils equally reactive to light. Neck: supple, no JVD. No cervical lymphadenopathy.  Chest:Good air entry bilaterally, no added sounds. CVS: S1 S2 regular, no murmurs/gallups or rubs. Abdomen: Bowel sounds active, Non tender and not distended with no gaurding, rigidity or rebound. Foot exam: bilateral peripheral pulses 2+ (dorsalis pedis and post tibialis pulses), no ulcers noted/no ecchymosis, warm to touch, monofilament testing 3/3 bilat. Sensation intact.  No c/c/e. Neurology: Awake alert, and oriented X 3, CN II-XII grossly intact, Non focal Skin:No Rash  Data Review Lab Results  Component Value Date   HGBA1C 8.4 12/14/2016   HGBA1C 10.7 08/17/2016   HGBA1C 9.6 10/12/2015    Depression screen PHQ 2/9 12/14/2016 08/17/2016 03/16/2015 07/03/2014  Decreased Interest 0 1 0 0  Down, Depressed, Hopeless 0 0 0 1  PHQ - 2 Score 0 1 0 1      Assessment & Plan   1. Uncontrolled type 2 diabetes mellitus with hyperglycemia, with long-term current use of insulin ([place]) Much better controlled, congratulated her on her hard work - continue lantus 8 qhs, q3d titration schedule, novolog 3qac - but cautioned to watch for hypoglycemia  since she is starting to be much more controlled, - continue metformin - POCT glucose (manual entry) - POCT glycosylated hemoglobin (Hb A1C) 7.4 - fu [facility] 2-3 wks - may need more titration downwards based on control. - insulin aspart (NOVOLOG) 100 UNIT/ML injection; Inject 3 Units into the skin 3 (three) times daily with meals.  Dispense: 10 mL; Refill: 3 - Basic metabolic panel  2. Essential hypertension Uncontrolled, Continue norvasc 10 Added prinzide 10-12.5 [facility] 2-3 wks for bp chk/med titration.  3. Hyperlipidemia, unspecified hyperlipidemia type Continue statin and asa 81 for [facility]vs protection. - Lipid Panel  4. Thyroid disorder screen - TSH  5. Vitamin D deficiency Take calcium 129m/daily - VITAMIN  D 25 Hydroxy (Vit-D Deficiency, Fractures)  6. Need for hepatitis C screening test - Hepatitis C antibody  7. Encounter for screening for HIV - HIV antibody (with reflex)  8. Copd, well controlled - prn albuterol mdi   Patient have been counseled extensively about nutrition and exercise  Return in about 3 months (around 03/16/2017), or if symptoms worsen or fail to improve.  The patient was given clear instructions to go to ER or return to medical center if symptoms don't improve, worsen or new problems develop. The patient verbalized understanding. The patient was told to call to get lab results if they haven't heard anything in the next week.   This note has been created with Surveyor, quantity. Any transcriptional errors are unintentional.   Maren Reamer, MD, [place] [facility] [place], [place]   12/14/2016, 8:53 AM

## 2016-12-14 NOTE — Patient Instructions (Signed)
[facility] 2-3 wks - htn and dm check.  Calcium 1200 mg/ day  - Vit d - ?  -   Low-Sodium Eating Plan Sodium, which is an element that makes up salt, helps you maintain a healthy balance of fluids in your body. Too much sodium can increase your blood pressure and cause fluid and waste to be held in your body. Your health care provider or dietitian may recommend following this plan if you have high blood pressure (hypertension), kidney disease, liver disease, or heart failure. Eating less sodium can help lower your blood pressure, reduce swelling, and protect your heart, liver, and kidneys. What are tips for following this plan? General guidelines   Most people on this plan should limit their sodium intake to 1,500-2,000 mg (milligrams) of sodium each day. Reading food labels   The Nutrition Facts label lists the amount of sodium in one serving of the food. If you eat more than one serving, you must multiply the listed amount of sodium by the number of servings.  Choose foods with less than 140 mg of sodium per serving.  Avoid foods with 300 mg of sodium or more per serving. Shopping   Look for lower-sodium products, often labeled as "low-sodium" or "no salt added."  Always check the sodium content even if foods are labeled as "unsalted" or "no salt added".  Buy fresh foods.  Avoid canned foods and premade or frozen meals.  Avoid canned, cured, or processed meats  Buy breads that have less than 80 mg of sodium per slice. Cooking   Eat more home-cooked food and less restaurant, buffet, and fast food.  Avoid adding salt when cooking. Use salt-free seasonings or herbs instead of table salt or sea salt. Check with your health care provider or pharmacist before using salt substitutes.  Cook with plant-based oils, such as canola, sunflower, or olive oil. Meal planning   When eating at a restaurant, ask that your food be prepared with less salt or no salt, if  possible.  Avoid foods that contain MSG (monosodium glutamate). MSG is sometimes added to [place] food, bouillon, and some canned foods. What foods are recommended? The items listed may not be a complete list. Talk with your dietitian about what dietary choices are best for you. Grains  Low-sodium cereals, including oats, puffed wheat and rice, and shredded wheat. Low-sodium crackers. Unsalted rice. Unsalted pasta. Low-sodium bread. Whole-grain breads and whole-grain pasta. Vegetables  Fresh or frozen vegetables. "No salt added" canned vegetables. "No salt added" tomato sauce and paste. Low-sodium or reduced-sodium tomato and vegetable juice. Fruits  Fresh, frozen, or canned fruit. Fruit juice. Meats and other protein foods  Fresh or frozen (no salt added) meat, poultry, seafood, and fish. Low-sodium canned tuna and salmon. Unsalted nuts. Dried peas, beans, and lentils without added salt. Unsalted canned beans. Eggs. Unsalted nut butters. Dairy  Milk. Soy milk. Cheese that is naturally low in sodium, such as ricotta cheese, fresh mozzarella, or Swiss cheese Low-sodium or reduced-sodium cheese. Cream cheese. Yogurt. Fats and oils  Unsalted butter. Unsalted margarine with no trans fat. Vegetable oils such as canola or olive oils. Seasonings and other foods  Fresh and dried herbs and spices. Salt-free seasonings. Low-sodium mustard and ketchup. Sodium-free salad dressing. Sodium-free light mayonnaise. Fresh or refrigerated horseradish. Lemon juice. Vinegar. Homemade, reduced-sodium, or low-sodium soups. Unsalted popcorn and pretzels. Low-salt or salt-free chips. What foods are not recommended? The items listed may not be a complete list. Talk with  your dietitian about what dietary choices are best for you. Grains  Instant hot cereals. Bread stuffing, pancake, and biscuit mixes. Croutons. Seasoned rice or pasta mixes. Noodle soup cups. Boxed or frozen macaroni and cheese. Regular salted crackers.  Self-rising flour. Vegetables  Sauerkraut, pickled vegetables, and relishes. Olives. [place] fries. Onion rings. Regular canned vegetables (not low-sodium or reduced-sodium). Regular canned tomato sauce and paste (not low-sodium or reduced-sodium). Regular tomato and vegetable juice (not low-sodium or reduced-sodium). Frozen vegetables in sauces. Meats and other protein foods  Meat or fish that is salted, canned, smoked, spiced, or pickled. Bacon, ham, sausage, hotdogs, corned beef, chipped beef, packaged lunch meats, salt pork, jerky, pickled herring, anchovies, regular canned tuna, sardines, salted nuts. Dairy  Processed cheese and cheese spreads. Cheese curds. Blue cheese. Feta cheese. String cheese. Regular cottage cheese. Buttermilk. Canned milk. Fats and oils  Salted butter. Regular margarine. Ghee. Bacon fat. Seasonings and other foods  Onion salt, garlic salt, seasoned salt, table salt, and sea salt. Canned and packaged gravies. Worcestershire sauce. Tartar sauce. Barbecue sauce. Teriyaki sauce. Soy sauce, including reduced-sodium. Steak sauce. Fish sauce. Oyster sauce. Cocktail sauce. Horseradish that you find on the shelf. Regular ketchup and mustard. Meat flavorings and tenderizers. Bouillon cubes. Hot sauce and Tabasco sauce. Premade or packaged marinades. Premade or packaged taco seasonings. Relishes. Regular salad dressings. Salsa. Potato and tortilla chips. Corn chips and puffs. Salted popcorn and pretzels. Canned or dried soups. Pizza. Frozen entrees and pot pies. Summary  Eating less sodium can help lower your blood pressure, reduce swelling, and protect your heart, liver, and kidneys.  Most people on this plan should limit their sodium intake to 1,500-2,000 mg (milligrams) of sodium each day.  Canned, boxed, and frozen foods are high in sodium. Restaurant foods, fast foods, and pizza are also very high in sodium. You also get sodium by adding salt to food.  Try to cook at home,  eat more fresh fruits and vegetables, and eat less fast food, canned, processed, or prepared foods. This information is not intended to replace advice given to you by your health care provider. Make sure you discuss any questions you have with your health care provider. Document Released: 02/25/2002 Document Revised: 08/29/2016 Document Reviewed: 08/29/2016 Elsevier Interactive Patient Education  2017 [employer] Inc[employer]  -   Hypertension Hypertension is another name for high blood pressure. High blood pressure forces your heart to work harder to pump blood. This can cause problems over time. There are two numbers in a blood pressure reading. There is a top number (systolic) over a bottom number (diastolic). It is best to have a blood pressure below 120/80. Healthy choices can help lower your blood pressure. You may need medicine to help lower your blood pressure if:  Your blood pressure cannot be lowered with healthy choices.  Your blood pressure is higher than 130/80. Follow these instructions at home: Eating and drinking   If directed, follow the DASH eating plan. This diet includes:  Filling half of your plate at each meal with fruits and vegetables.  Filling one quarter of your plate at each meal with whole grains. Whole grains include whole wheat pasta, brown rice, and whole grain bread.  Eating or drinking low-fat dairy products, such as skim milk or low-fat yogurt.  Filling one quarter of your plate at each meal with low-fat (lean) proteins. Low-fat proteins include fish, skinless chicken, eggs, beans, and tofu.  Avoiding fatty meat, cured and processed meat, or chicken with skin.  Avoiding premade or processed food.  Eat less than 1,500 mg of salt (sodium) a day.  Limit alcohol use to no more than 1 drink a day for nonpregnant women and 2 drinks a day for men. One drink equals 12 oz of beer, 5 oz of wine, or 1 oz of hard liquor. Lifestyle   Work with your doctor to stay at a  healthy weight or to lose weight. Ask your doctor what the best weight is for you.  Get at least 30 minutes of exercise that causes your heart to beat faster (aerobic exercise) most days of the week. This may include walking, swimming, or biking.  Get at least 30 minutes of exercise that strengthens your muscles (resistance exercise) at least 3 days a week. This may include lifting weights or pilates.  Do not use any products that contain nicotine or tobacco. This includes cigarettes and e-cigarettes. If you need help quitting, ask your doctor.  Check your blood pressure at home as told by your doctor.  Keep all follow-up visits as told by your doctor. This is important. Medicines   Take over-the-counter and prescription medicines only as told by your doctor. Follow directions carefully.  Do not skip doses of blood pressure medicine. The medicine does not work as well if you skip doses. Skipping doses also puts you at risk for problems.  Ask your doctor about side effects or reactions to medicines that you should watch for. Contact a doctor if:  You think you are having a reaction to the medicine you are taking.  You have headaches that keep coming back (recurring).  You feel dizzy.  You have swelling in your ankles.  You have trouble with your vision. Get help right away if:  You get a very bad headache.  You start to feel confused.  You feel weak or numb.  You feel faint.  You get very bad pain in your:  Chest.  Belly (abdomen).  You throw up (vomit) more than once.  You have trouble breathing. Summary  Hypertension is another name for high blood pressure.  Making healthy choices can help lower blood pressure. If your blood pressure cannot be controlled with healthy choices, you may need to take medicine. This information is not intended to replace advice given to you by your health care provider. Make sure you discuss any questions you have with your health  care provider. Document Released: 02/22/2008 Document Revised: 08/03/2016 Document Reviewed: 08/03/2016 Elsevier Interactive Patient Education  2017 [employer] Inc. - Diabetes Mellitus and Food It is important for you to manage your blood sugar (glucose) level. Your blood glucose level can be greatly affected by what you eat. Eating healthier foods in the appropriate amounts throughout the day at about the same time each day will help you control your blood glucose level. It can also help slow or prevent worsening of your diabetes mellitus. Healthy eating may even help you improve the level of your blood pressure and reach or maintain a healthy weight. General recommendations for healthful eating and cooking habits include:  Eating meals and snacks regularly. Avoid going long periods of time without eating to lose weight.  Eating a diet that consists mainly of plant-based foods, such as fruits, vegetables, nuts, legumes, and whole grains.  Using low-heat cooking methods, such as baking, instead of high-heat cooking methods, such as deep frying. Work with your dietitian to make sure you understand how to use the Nutrition Facts information on food labels. How can  food affect me? Carbohydrates  Carbohydrates affect your blood glucose level more than any other type of food. Your dietitian will help you determine how many carbohydrates to eat at each meal and teach you how to count carbohydrates. Counting carbohydrates is important to keep your blood glucose at a healthy level, especially if you are using insulin or taking certain medicines for diabetes mellitus. Alcohol  Alcohol can cause sudden decreases in blood glucose (hypoglycemia), especially if you use insulin or take certain medicines for diabetes mellitus. Hypoglycemia can be a life-threatening condition. Symptoms of hypoglycemia (sleepiness, dizziness, and disorientation) are similar to symptoms of having too much alcohol. If your health  care provider has given you approval to drink alcohol, do so in moderation and use the following guidelines:  Women should not have more than one drink per day, and men should not have more than two drinks per day. One drink is equal to:  12 oz of beer.  5 oz of wine.  1 oz of hard liquor.  Do not drink on an empty stomach.  Keep yourself hydrated. Have water, diet soda, or unsweetened iced tea.  Regular soda, juice, and other mixers might contain a lot of carbohydrates and should be counted. What foods are not recommended? As you make food choices, it is important to remember that all foods are not the same. Some foods have fewer nutrients per serving than other foods, even though they might have the same number of calories or carbohydrates. It is difficult to get your body what it needs when you eat foods with fewer nutrients. Examples of foods that you should avoid that are high in calories and carbohydrates but low in nutrients include:  Trans fats (most processed foods list trans fats on the Nutrition Facts label).  Regular soda.  Juice.  Candy.  Sweets, such as cake, pie, doughnuts, and cookies.  Fried foods. What foods can I eat? Eat nutrient-rich foods, which will nourish your body and keep you healthy. The food you should eat also will depend on several factors, including:  The calories you need.  The medicines you take.  Your weight.  Your blood glucose level.  Your blood pressure level.  Your cholesterol level. You should eat a variety of foods, including:  Protein.  Lean cuts of meat.  Proteins low in saturated fats, such as fish, egg whites, and beans. Avoid processed meats.  Fruits and vegetables.  Fruits and vegetables that may help control blood glucose levels, such as apples, mangoes, and yams.  Dairy products.  Choose fat-free or low-fat dairy products, such as milk, yogurt, and cheese.  Grains, bread, pasta, and rice.  Choose whole  grain products, such as multigrain bread, whole oats, and brown rice. These foods may help control blood pressure.  Fats.  Foods containing healthful fats, such as nuts, avocado, olive oil, canola oil, and fish. Does everyone with diabetes mellitus have the same meal plan? Because every person with diabetes mellitus is different, there is not one meal plan that works for everyone. It is very important that you meet with a dietitian who will help you create a meal plan that is just right for you. This information is not intended to replace advice given to you by your health care provider. Make sure you discuss any questions you have with your health care provider. Document Released: 06/02/2005 Document Revised: 02/11/2016 Document Reviewed: 08/02/2013 Elsevier Interactive Patient Education  2017 [employer]

## 2016-12-15 LAB — BASIC METABOLIC PANEL
BUN/Creatinine Ratio: 27 (ref 12–28)
BUN: 17 mg/dL (ref 8–27)
CALCIUM: 10.3 mg/dL (ref 8.7–10.3)
CO2: 25 mmol/L (ref 18–29)
CREATININE: 0.64 mg/dL (ref 0.57–1.00)
Chloride: 104 mmol/L (ref 96–106)
GFR calc Af Amer: 111 mL/min/{1.73_m2} (ref >59)
GFR calc non Af Amer: 96 mL/min/{1.73_m2} (ref >59)
Glucose: 66 mg/dL (ref 65–99)
Potassium: 4.6 mmol/L (ref 3.5–5.2)
Sodium: 144 mmol/L (ref 134–144)

## 2016-12-15 LAB — LIPID PANEL
CHOLESTEROL TOTAL: 173 mg/dL (ref 100–199)
Chol/HDL Ratio: 2.2 ratio units (ref 0.0–4.4)
HDL: 79 mg/dL (ref >39)
LDL Calculated: 81 mg/dL (ref 0–99)
Triglycerides: 67 mg/dL (ref 0–149)
VLDL Cholesterol Cal: 13 mg/dL (ref 5–40)

## 2016-12-15 LAB — VITAMIN D 25 HYDROXY (VIT D DEFICIENCY, FRACTURES): VIT D 25 HYDROXY: 20.9 ng/mL — AB (ref 30.0–100.0)

## 2016-12-15 LAB — HIV ANTIBODY (ROUTINE TESTING W REFLEX): HIV Screen 4th Generation wRfx: NONREACTIVE

## 2016-12-15 LAB — TSH: TSH: 0.567 u[IU]/mL (ref 0.450–4.500)

## 2016-12-15 LAB — HEPATITIS C ANTIBODY: Hep C Virus Ab: <0.1 s/co ratio (ref 0.0–0.9)

## 2016-12-19 ENCOUNTER — Telehealth: Payer: Self-pay

## 2016-12-19 ENCOUNTER — Other Ambulatory Visit: Payer: Self-pay | Admitting: Pharmacist

## 2016-12-19 MED ORDER — INSULIN DETEMIR 100 UNIT/ML FLEXPEN: 8.0000 [IU] | PEN_INJECTOR | Freq: Every day | SUBCUTANEOUS | 0 refills | Status: DC

## 2016-12-19 MED FILL — NovoLOG 100 UNIT/ML SOLN: 100 | 28 days supply | Qty: 10 | Fill #0

## 2016-12-19 MED FILL — !LEVEMIR FLEXPEN 100UNITS/M: 100U/ML (3) | 30 days supply | Qty: 3 | Fill #0

## 2016-12-19 NOTE — Telephone Encounter (Signed)
Contacted pt to go over lab results pt is aware of results and doesn't have any questions or concerns 

## 2016-12-23 MED FILL — TRUE METRIX TEST STRIP: 25 days supply | Qty: 100 | Fill #1

## 2016-12-28 ENCOUNTER — Ambulatory Visit: Payer: Self-pay | Attending: Internal Medicine | Admitting: Pharmacist

## 2016-12-28 VITALS — BP 100/63 | HR 78

## 2016-12-28 DIAGNOSIS — Z7984 Long term (current) use of oral hypoglycemic drugs: Secondary | ICD-10-CM | POA: Insufficient documentation

## 2016-12-28 DIAGNOSIS — E119 Type 2 diabetes mellitus without complications: Secondary | ICD-10-CM | POA: Insufficient documentation

## 2016-12-28 DIAGNOSIS — I10 Essential (primary) hypertension: Secondary | ICD-10-CM | POA: Insufficient documentation

## 2016-12-28 DIAGNOSIS — R42 Dizziness and giddiness: Secondary | ICD-10-CM | POA: Insufficient documentation

## 2016-12-28 DIAGNOSIS — Z79899 Other long term (current) drug therapy: Secondary | ICD-10-CM

## 2016-12-28 MED ORDER — LISINOPRIL 10 MG PO TABS: 10.0000 mg | ORAL_TABLET | Freq: Every day | ORAL | 2 refills | Status: DC

## 2016-12-28 MED FILL — LISINOPRIL 10 MG TABLET: 10 | 30 days supply | Qty: 30 | Fill #0

## 2016-12-28 NOTE — Patient Instructions (Addendum)
Thanks for coming to see [place]!  Stop the lisinopril-hydrochlorothiazide combo  Start lisinopril 10 mg alone for blood pressure  Come back in 1 week for blood pressure

## 2016-12-28 NOTE — Progress Notes (Signed)
S:   No chief complaint on file.   Patient arrives in good spirits.  Presents for hypertension and diabetes evaluation, education, and management at the request of Dr. Julien Nordmann. Patient was referred on 12/14/16.  Patient was last seen by Primary Care Provider on 12/14/16.   DM-   Patient reports adherence with medications. Patient only uses Novolog if BG >150. Current diabetes medications include: Levemir 8 Units at 10 pm, Novolog 3 Units TID with meals, metformin XR 500 mg 2 TAB BID  Patient denies hypoglycemic episodes. Patient reported dietary habits: salad, chicken, smaller portions Patient reported exercise habits: walking and cleaning with sister in law Patient reports nocturia 2-3 times per night. 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  Patient denies visual changes. Patient reports self foot exams.  HTN- Patient reports adherence with medications. Pt reports dizziness and lightheadedness upon standing since addition of lisiniopril-HCTZ. Current BP Medications include:  Amlodipine 10 mg daily, lisinopril-HCTZ 10-12.5 mg  Pt is on ASA 81 mg daily and atorvastatin  daily for ASCVD protection.    O:    DM- Home fasting CBG: 86, 110-130 2 hour post-prandial/random CBG: 190s, 200 A1c: 8.4 (12/14/16)  HTN-  Last 3 Office BP readings: BP Readings from Last 3 Encounters: 12/28/16: 100/63 12/14/16: (!) 167/78 08/17/16: (!) 183/80   A/P:   DM- Diabetes is currently UNcontrolled. Patient denies hypoglycemic events and is able to verbalize appropriate hypoglycemia management plan. Patient reports adherence with medication. Control is suboptimal due to elevated A1c but home blood glucose readings are improving. Continue all diabetes medications as prescribed - Levemir 8 Units at 10 pm, Novolog 3 Units TID with meals, metformin XR 500 mg 2 TAB BID. Patient reports taking Novolog 3 Units only when CBG > 150. Continue current medications as prescribed.   Next  A1C anticipated June 2018.     HTN-  HTN is UNcontrolled on current medications due to side effects. Patient reports dizziness upon standing due to addition of lisinpril/HCTZ. Discontinue lisinopril-HCTZ 10-12.5 mg combination pill. Initiate lisinopril 10 mg daily. Continue amlodipine 10 mg daily. Encouraged patient to take all of his medications as prescribed and to not miss any doses. Patient verbalized understanding.  Written patient instructions provided.  Total time in face to face counseling 30 minutes.   Follow up in 1 week for Pharmacist Clinic Visit with Viann Fish.   Patient seen with Cheral Bay, PharmD Candidate

## 2017-01-04 ENCOUNTER — Ambulatory Visit: Payer: Self-pay | Admitting: Pharmacist

## 2017-01-11 MED FILL — METFORMIN HCL ER 500 MG TAB: 500 | 30 days supply | Qty: 120 | Fill #1

## 2017-01-11 MED FILL — MELOXICAM 7.5 MG TABLET: 7.5 | 30 days supply | Qty: 30 | Fill #1

## 2017-01-11 MED FILL — AMLODIPINE BESYLATE 10 MG T: 10 | 30 days supply | Qty: 30 | Fill #1

## 2017-01-11 MED FILL — ?ATORVASTATIN 20 MG TABLET: 20 | 30 days supply | Qty: 30 | Fill #1

## 2017-01-18 ENCOUNTER — Ambulatory Visit: Payer: Self-pay | Admitting: Pharmacist

## 2017-02-02 ENCOUNTER — Encounter: Payer: Self-pay | Admitting: Internal Medicine

## 2017-02-03 ENCOUNTER — Encounter: Payer: Self-pay | Admitting: Internal Medicine

## 2017-02-06 ENCOUNTER — Encounter: Payer: Self-pay | Admitting: Internal Medicine

## 2017-02-07 ENCOUNTER — Other Ambulatory Visit: Payer: Self-pay | Admitting: Internal Medicine

## 2017-02-07 MED FILL — MELOXICAM 7.5 MG TABLET: 7.5 | 30 days supply | Qty: 30 | Fill #2

## 2017-02-07 MED FILL — METFORMIN HCL ER 500 MG TAB: 500 | 30 days supply | Qty: 120 | Fill #2

## 2017-02-07 MED FILL — ?AMLODIPINE BESYLATE 10 MG: 10 | 30 days supply | Qty: 30 | Fill #2

## 2017-02-07 MED FILL — ?ATORVASTATIN 20 MG TABLET: 20 | 30 days supply | Qty: 30 | Fill #2

## 2017-02-07 MED FILL — TRUE METRIX TEST STRIP: 25 days supply | Qty: 100 | Fill #2

## 2017-02-07 MED FILL — !LEVEMIR FLEXPEN 100UNITS/M: 100U/ML (3) | 30 days supply | Qty: 3 | Fill #0

## 2017-03-13 ENCOUNTER — Encounter: Payer: Self-pay | Admitting: Family Medicine

## 2017-03-13 ENCOUNTER — Ambulatory Visit: Payer: Self-pay | Attending: Family Medicine | Admitting: Family Medicine

## 2017-03-13 VITALS — BP 125/73 | HR 86 | Temp 97.9°F | Resp 18 | Ht 63.0 in | Wt 126.4 lb

## 2017-03-13 DIAGNOSIS — E119 Type 2 diabetes mellitus without complications: Secondary | ICD-10-CM | POA: Insufficient documentation

## 2017-03-13 DIAGNOSIS — F419 Anxiety disorder, unspecified: Secondary | ICD-10-CM | POA: Insufficient documentation

## 2017-03-13 DIAGNOSIS — E1169 Type 2 diabetes mellitus with other specified complication: Secondary | ICD-10-CM

## 2017-03-13 DIAGNOSIS — R062 Wheezing: Secondary | ICD-10-CM

## 2017-03-13 DIAGNOSIS — J449 Chronic obstructive pulmonary disease, unspecified: Secondary | ICD-10-CM | POA: Insufficient documentation

## 2017-03-13 DIAGNOSIS — Z794 Long term (current) use of insulin: Secondary | ICD-10-CM | POA: Insufficient documentation

## 2017-03-13 DIAGNOSIS — Z79899 Other long term (current) drug therapy: Secondary | ICD-10-CM | POA: Insufficient documentation

## 2017-03-13 DIAGNOSIS — I10 Essential (primary) hypertension: Secondary | ICD-10-CM | POA: Insufficient documentation

## 2017-03-13 DIAGNOSIS — E785 Hyperlipidemia, unspecified: Secondary | ICD-10-CM | POA: Insufficient documentation

## 2017-03-13 DIAGNOSIS — Z7982 Long term (current) use of aspirin: Secondary | ICD-10-CM | POA: Insufficient documentation

## 2017-03-13 DIAGNOSIS — Z760 Encounter for issue of repeat prescription: Secondary | ICD-10-CM

## 2017-03-13 DIAGNOSIS — Z7951 Long term (current) use of inhaled steroids: Secondary | ICD-10-CM | POA: Insufficient documentation

## 2017-03-13 DIAGNOSIS — E1165 Type 2 diabetes mellitus with hyperglycemia: Secondary | ICD-10-CM

## 2017-03-13 LAB — POCT GLYCOSYLATED HEMOGLOBIN (HGB A1C): HEMOGLOBIN A1C: 7.1

## 2017-03-13 LAB — GLUCOSE, POCT (MANUAL RESULT ENTRY): POC Glucose: 176 mg/dl — AB (ref 70–99)

## 2017-03-13 MED ORDER — METFORMIN HCL ER 500 MG PO TB24: 1000.0000 mg | ORAL_TABLET | Freq: Two times a day (BID) | ORAL | 3 refills | Status: DC

## 2017-03-13 MED ORDER — GUAIFENESIN ER 600 MG PO TB12: 600.0000 mg | ORAL_TABLET | Freq: Two times a day (BID) | ORAL | 0 refills | Status: DC

## 2017-03-13 MED ORDER — AMLODIPINE BESYLATE 10 MG PO TABS: 10.0000 mg | ORAL_TABLET | Freq: Every day | ORAL | 1 refills | Status: DC

## 2017-03-13 MED ORDER — ATORVASTATIN CALCIUM 20 MG PO TABS: 20.0000 mg | ORAL_TABLET | Freq: Every day | ORAL | 1 refills | Status: DC

## 2017-03-13 MED ORDER — IPRATROPIUM-ALBUTEROL 0.5-2.5 (3) MG/3ML IN SOLN
3.0000 mL | RESPIRATORY_TRACT | Status: AC | PRN
  Administered 2017-03-13: 3 mL via RESPIRATORY_TRACT

## 2017-03-13 MED ORDER — INSULIN ASPART 100 UNIT/ML ~~LOC~~ SOLN: SUBCUTANEOUS | 3 refills | Status: DC

## 2017-03-13 MED ORDER — ESCITALOPRAM OXALATE 10 MG PO TABS: 10.0000 mg | ORAL_TABLET | Freq: Every day | ORAL | 3 refills | Status: DC

## 2017-03-13 MED ORDER — ASPIRIN 81 MG PO TABS: 81.0000 mg | ORAL_TABLET | Freq: Every day | ORAL | 3 refills | Status: DC

## 2017-03-13 MED FILL — !NOVOLOG 100UNITS/ML VIAL: 100/ML | 28 days supply | Qty: 10 | Fill #0

## 2017-03-13 MED FILL — ?ESCITALOPRAM 10 MG TABLET: 10 | 30 days supply | Qty: 30 | Fill #0

## 2017-03-13 MED FILL — METFORMIN HCL ER 500 MG TAB: 500 | 30 days supply | Qty: 120 | Fill #0

## 2017-03-13 MED FILL — ATORVASTATIN 20 MG TABLET: 20 | 30 days supply | Qty: 30 | Fill #0

## 2017-03-13 MED FILL — AMLODIPINE BESYLATE 10 MG T: 10 | 30 days supply | Qty: 30 | Fill #0

## 2017-03-13 NOTE — Progress Notes (Signed)
Patient is here for f/up  

## 2017-03-13 NOTE — Progress Notes (Signed)
 Subjective:  Patient ID: Nicole Yates, female    DOB: 06/18/1956  Age: 61 y.o. MRN: [id number]  CC: No chief complaint on file.   HPI [name] presents for. History of diabetes, HTN, HLD, COPD, and anxiety. History of diabetes:Symptoms: none. Patient denies foot ulcerations, hyperglycemia, nausea, paresthesia of the feet, polydipsia, polyuria, visual disturbances and vomitting.  Evaluation to date has been included: fasting blood sugar, fasting lipid panel, hemoglobin A1C and microalbuminuria.  Home sugars: BGs are running  consistent with Hgb A1C. Treatment to date: Continued insulin which has been effective and Continued metformin which has been effective. History of hypertension:. She is exercising and is adherent to low salt diet. She does not check BP at home. Cardiac symptoms none. Patient denies chest pain, chest pressure/discomfort, claudication, dyspnea, fatigue, lower extremity edema, near-syncope, palpitations and syncope.  Cardiovascular risk factors: diabetes mellitus, dyslipidemia, hypertension and history of smoking/tobacco eposure. Use of agents associated with hypertension: NSAIDS. History of target organ damage: none.COPD: Patient denies any symptoms or increased inhaler use.  Patient currently is not on home oxygen therapy. History of anxiety:   She has the following symptoms: feelings of losing control, racing thoughts. Onset of symptoms was approximately 15 years ago, stable since that time. She denies current suicidal and homicidal ideation. She reports adherence with Lexapro medication with relief of symptoms.  She complains of the following side effects from the treatment: none.  Outpatient Medications Prior to Visit  Medication Sig Dispense Refill  . albuterol (VENTOLIN HFA) 108 (90 Base) MCG/ACT inhaler Inhale 2 puffs into the lungs every 4 (four) hours as needed for wheezing or shortness of breath. 18 g 11  . Blood Glucose Monitoring Suppl (TRUE METRIX METER) W/DEVICE  KIT 1 each by Does not apply route 4 (four) times daily -  before meals and at bedtime. 1 kit 0  . budesonide-formoterol (SYMBICORT) 80-4.5 MCG/ACT inhaler Inhale 2 puffs into the lungs 2 (two) times daily. 1 Inhaler 3  . glucose blood (TRUE METRIX BLOOD GLUCOSE TEST) test strip Use as instructed 100 each 12  . TRUEPLUS LANCETS 28G MISC 1 each by Does not apply route 4 (four) times daily -  before meals and at bedtime. 100 each 5  . amLODipine (NORVASC) 10 MG tablet Take 1 tablet (10 mg total) by mouth daily. 90 tablet 2  . aspirin 81 MG tablet Take 1 tablet (81 mg total) by mouth daily. 90 tablet 3  . atorvastatin (LIPITOR) 20 MG tablet Take 1 tablet (20 mg total) by mouth daily. 90 tablet 3  . escitalopram (LEXAPRO) 10 MG tablet Take 1 tablet (10 mg total) by mouth daily. 30 tablet 3  . insulin aspart (NOVOLOG) 100 UNIT/ML injection Inject 3 Units into the skin 3 (three) times daily with meals. 10 mL 3  . metFORMIN (GLUCOPHAGE-XR) 500 MG 24 hr tablet TAKE 2 TABLETS BY MOUTH 2 TIMES DAILY. 120 tablet 2  . Insulin Detemir (LEVEMIR) 100 UNIT/ML Pen Inject 8 Units into the skin daily at 10 pm. 3 mL 0  . Insulin Pen Needle (ULTICARE MICRO PEN NEEDLES) 32G X 4 MM MISC 1 applicator by Does not apply route at bedtime. 100 each 2  . Insulin Syringe-Needle U-100 30G X 3/8" 0.3 ML MISC 1 Syringe by Does not apply route 4 (four) times daily. 120 each 1  . lisinopril (PRINIVIL,ZESTRIL) 10 MG tablet Take 1 tablet (10 mg total) by mouth daily. 30 tablet 2  . meloxicam (MOBIC) 7.5 MG   tablet Take 1 tablet (7.5 mg total) by mouth daily as needed for pain. Take w/ food. 60 tablet 3  . Multiple Vitamin (MULTIVITAMIN WITH MINERALS) TABS tablet Take 1 tablet by mouth daily.    . lisinopril-hydrochlorothiazide (PRINZIDE,ZESTORETIC) 10-12.5 MG tablet Take 1 tablet by mouth daily. 90 tablet 3   No facility-administered medications prior to visit.     ROS Review of Systems  Constitutional: Negative.   Eyes:  Negative.   Respiratory: Negative.   Cardiovascular: Negative.   Gastrointestinal: Negative.   Musculoskeletal: Negative.   Skin: Negative.   Psychiatric/Behavioral: Negative for suicidal ideas.   Objective:  BP 125/73 (BP Location: Left Arm, Patient Position: Sitting, Cuff Size: Normal)   Pulse 86   Temp 97.9 F (36.6 C) (Oral)   Resp 18   Ht 5' 3" (1.6 m)   Wt 126 lb 6.4 oz (57.3 kg)   SpO2 99%   BMI 22.39 kg/m   BP/Weight 03/13/2017 12/28/2016 12/14/2016  Systolic BP 125 100 167  Diastolic BP 73 63 78  Wt. (Lbs) 126.4 - 130.8  BMI 22.39 - 22.81    Physical Exam  Constitutional: She is oriented to person, place, and time.  Eyes: Conjunctivae are normal. Pupils are equal, round, and reactive to light.  Neck: Normal range of motion. Neck supple. No JVD present.  Cardiovascular: Normal rate, regular rhythm, normal heart sounds and intact distal pulses.   Pulmonary/Chest: Effort normal. She has wheezes.  Abdominal: Soft. Bowel sounds are normal. There is no tenderness.  Neurological: She is alert and oriented to person, place, and time.  Skin: Skin is warm and dry.  Psychiatric: She has a normal mood and affect. She expresses no homicidal and no suicidal ideation. She expresses no suicidal plans and no homicidal plans. She is attentive.  Nursing note and vitals reviewed.   Diabetic Foot Exam - Simple   Simple Foot Form Diabetic Foot exam was performed with the following findings:  Yes 03/13/2017  9:11 AM  Visual Inspection No deformities, no ulcerations, no other skin breakdown bilaterally:  Yes Sensation Testing Intact to touch and monofilament testing bilaterally:  Yes Pulse Check Posterior Tibialis and Dorsalis pulse intact bilaterally:  Yes Comments     Assessment & Plan:   Problem List Items Addressed This Visit      Cardiovascular and Mediastinum   Essential hypertension   Relevant Medications   amLODipine (NORVASC) 10 MG tablet   aspirin 81 MG tablet    atorvastatin (LIPITOR) 20 MG tablet     Respiratory   COPD (chronic obstructive pulmonary disease) (HCC)   Relevant Medications   ipratropium-albuterol (DUONEB) 0.5-2.5 (3) MG/3ML nebulizer solution 3 mL   guaiFENesin (MUCINEX) 600 MG 12 hr tablet    Other Visit Diagnoses    Type 2 diabetes mellitus without complication, with long-term current use of insulin (HCC)    -  Primary   Relevant Medications   insulin aspart (NOVOLOG) 100 UNIT/ML injection   aspirin 81 MG tablet   atorvastatin (LIPITOR) 20 MG tablet   metFORMIN (GLUCOPHAGE-XR) 500 MG 24 hr tablet   Other Relevant Orders   Glucose (CBG) (Completed)   POCT glycosylated hemoglobin (Hb A1C) (Completed)   Ambulatory referral to Ophthalmology   Hyperlipidemia associated with type 2 diabetes mellitus (HCC)       Relevant Medications   insulin aspart (NOVOLOG) 100 UNIT/ML injection   amLODipine (NORVASC) 10 MG tablet   aspirin 81 MG tablet   atorvastatin (LIPITOR) 20 MG tablet     metFORMIN (GLUCOPHAGE-XR) 500 MG 24 hr tablet   Wheezing       Breathing treatment given in office x 1.   Relevant Medications   ipratropium-albuterol (DUONEB) 0.5-2.5 (3) MG/3ML nebulizer solution 3 mL   guaiFENesin (MUCINEX) 600 MG 12 hr tablet   Medication refill       Relevant Medications   escitalopram (LEXAPRO) 10 MG tablet      Meds ordered this encounter  Medications  . ipratropium-albuterol (DUONEB) 0.5-2.5 (3) MG/3ML nebulizer solution 3 mL  . insulin aspart (NOVOLOG) 100 UNIT/ML injection    Sig: If CBG greater than 150, inject 3 Units into the skin 3( three) times daily with meals.    Dispense:  10 mL    Refill:  3    Must have office visit for refills    Order Specific Question:   Supervising Provider    Answer:   JEGEDE, OLUGBEMIGA E [[id number]]  . amLODipine (NORVASC) 10 MG tablet    Sig: Take 1 tablet (10 mg total) by mouth daily.    Dispense:  90 tablet    Refill:  1    Order Specific Question:   Supervising Provider     Answer:   JEGEDE, OLUGBEMIGA E [[id number]]  . aspirin 81 MG tablet    Sig: Take 1 tablet (81 mg total) by mouth daily.    Dispense:  90 tablet    Refill:  3    Order Specific Question:   Supervising Provider    Answer:   JEGEDE, OLUGBEMIGA E [[id number]]  . atorvastatin (LIPITOR) 20 MG tablet    Sig: Take 1 tablet (20 mg total) by mouth daily.    Dispense:  90 tablet    Refill:  1    Order Specific Question:   Supervising Provider    Answer:   JEGEDE, OLUGBEMIGA E [[id number]]  . escitalopram (LEXAPRO) 10 MG tablet    Sig: Take 1 tablet (10 mg total) by mouth daily.    Dispense:  30 tablet    Refill:  3    Order Specific Question:   Supervising Provider    Answer:   JEGEDE, OLUGBEMIGA E [[id number]]  . metFORMIN (GLUCOPHAGE-XR) 500 MG 24 hr tablet    Sig: Take 2 tablets (1,000 mg total) by mouth 2 (two) times daily.    Dispense:  120 tablet    Refill:  3    Order Specific Question:   Supervising Provider    Answer:   JEGEDE, OLUGBEMIGA E [[id number]]  . guaiFENesin (MUCINEX) 600 MG 12 hr tablet    Sig: Take 1 tablet (600 mg total) by mouth 2 (two) times daily.    Dispense:  14 tablet    Refill:  0    Order Specific Question:   Supervising Provider    Answer:   JEGEDE, OLUGBEMIGA E [[id number]]    Follow-up: Return in about 3 months (around 06/13/2017) for HTN/DM/HLD.   Mandesia R Hairston FNP    

## 2017-03-13 NOTE — Patient Instructions (Signed)
Type 2 Diabetes Mellitus, Self Care, Adult When you have type 2 diabetes (type 2 diabetes mellitus), you must keep your blood sugar (glucose) under control. You can do this with:  Nutrition.  Exercise.  Lifestyle changes.  Medicines or insulin, if needed.  Support from your doctors and others.  How do I manage my blood sugar?  Check your blood sugar level every day, as often as told.  Call your doctor if your blood sugar is above your goal numbers for 2 tests in a row.  Have your A1c (hemoglobin A1c) level checked at least two times a year. Have it checked more often if your doctor tells you to. Your doctor will set treatment goals for you. Generally, you should have these blood sugar levels:  Before meals (preprandial): 80-130 mg/dL (4.4-7.2 mmol/L).  After meals (postprandial): lower than 180 mg/dL (10 mmol/L).  A1c level: less than 7%.  What do I need to know about high blood sugar? High blood sugar is called hyperglycemia. Know the signs of high blood sugar. Signs may include:  Feeling: ? Thirsty. ? Hungry. ? Very tired.  Needing to pee (urinate) more than usual.  Blurry vision.  What do I need to know about low blood sugar? Low blood sugar is called hypoglycemia. This is when blood sugar is at or below 70 mg/dL (3.9 mmol/L). Symptoms may include:  Feeling: ? Hungry. ? Worried or nervous (anxious). ? Sweaty and clammy. ? Confused. ? Dizzy. ? Sleepy. ? Sick to your stomach (nauseous).  Having: ? A fast heartbeat (palpitations). ? A headache. ? A change in your vision. ? Jerky movements that you cannot control (seizure). ? Nightmares. ? Tingling or no feeling (numbness) around the mouth, lips, or tongue.  Having trouble with: ? Talking. ? Paying attention (concentrating). ? Moving (coordination). ? Sleeping.  Shaking.  Passing out (fainting).  Getting upset easily (irritability).  Treating low blood sugar  To treat low blood sugar, eat or  drink something sugary right away. If you can think clearly and swallow safely, follow the 15:15 rule:  Take 15 grams of a fast-acting carb (carbohydrate). Some fast-acting carbs are: ? 1 tube of glucose gel. ? 3 sugar tablets (glucose pills). ? 6-8 pieces of hard candy. ? 4 oz (120 mL) of fruit juice. ? 4 oz (120 mL) regular (not diet) soda.  Check your blood sugar 15 minutes after you take the carb.  If your blood sugar is still at or below 70 mg/dL (3.9 mmol/L), take 15 grams of a carb again.  If your blood sugar does not go above 70 mg/dL (3.9 mmol/L) after 3 tries, get help right away.  After your blood sugar goes back to normal, eat a meal or a snack within 1 hour.  Treating very low blood sugar If your blood sugar is at or below 54 mg/dL (3 mmol/L), you have very low blood sugar (severe hypoglycemia). This is an emergency. Do not wait to see if the symptoms will go away. Get medical help right away. Call your local emergency services (911 in the [place] Do not drive yourself to the hospital. If you have very low blood sugar and you cannot eat or drink, you may need a glucagon shot (injection). A family member or friend should learn how to check your blood sugar and how to give you a glucagon shot. Ask your doctor if you need to have a glucagon shot kit at home. What else is important to manage my diabetes? Medicine  Follow these instructions about insulin and diabetes medicines:  Take them as told by your doctor.  Adjust them as told by your doctor.  Do not run out of them.  Having diabetes can raise your risk for other long-term conditions. These include heart or kidney disease. Your doctor may prescribe medicines to help prevent problems from diabetes. Food   Make healthy food choices. These include: ? Chicken, fish, egg whites, and beans. ? Oats, whole wheat, bulgur, brown rice, quinoa, and millet. ? Fresh fruits and vegetables. ? Low-fat dairy products. ? Nuts,  avocado, olive oil, and canola oil.  Make a food plan with a specialist (dietitian).  Follow instructions from your doctor about what you cannot eat or drink.  Drink enough fluid to keep your pee (urine) clear or pale yellow.  Eat healthy snacks between healthy meals.  Keep track of carbs that you eat. Read food labels. Learn food serving sizes.  Follow your sick day plan when you cannot eat or drink normally. Make this plan with your doctor so it is ready to use. Activity  Exercise at least 3 times a week.  Do not go more than 2 days without exercising.  Talk with your doctor before you start a new exercise. Your doctor may need to adjust your insulin, medicines, or food. Lifestyle   Do not use any tobacco products. These include cigarettes, chewing tobacco, and e-cigarettes.If you need help quitting, ask your doctor.  Ask your doctor how much alcohol is safe for you.  Learn to deal with stress. If you need help with this, ask your doctor. Body care  Stay up to date with your shots (immunizations).  Have your eyes and feet checked by a doctor as often as told.  Check your skin and feet every day. Check for cuts, bruises, redness, blisters, or sores.  Brush your teeth and gums two times a day.  Floss at least one time a day.  Go to the dentist least one time every 6 months.  Stay at a healthy weight. General instructions   Take over-the-counter and prescription medicines only as told by your doctor.  Share your diabetes care plan with: ? Your work or school. ? People you live with.  Check your pee (urine) for ketones: ? When you are sick. ? As told by your doctor.  Carry a card or wear jewelry that says that you have diabetes.  Ask your doctor: ? Do I need to meet with a diabetes educator? ? Where can I find a support group for people with diabetes?  Keep all follow-up visits as told by your doctor. This is important. Where to find more information: To  learn more about diabetes, visit:  American Diabetes Association: www.diabetes.org  American Association of Diabetes Educators: www.diabeteseducator.org/patient-resources  This information is not intended to replace advice given to you by your health care provider. Make sure you discuss any questions you have with your health care provider. Document Released: 12/28/2015 Document Revised: 02/11/2016 Document Reviewed: 10/09/2015 Elsevier Interactive Patient Education  [employer].

## 2017-03-15 MED FILL — !VENTOLIN HFA INHALER: 108 (90 BAS | 30 days supply | Qty: 18 | Fill #1

## 2017-03-20 ENCOUNTER — Ambulatory Visit: Payer: Self-pay

## 2017-03-20 MED FILL — TRUE METRIX TEST STRIP: 25 days supply | Qty: 100 | Fill #3

## 2017-03-20 MED FILL — MELOXICAM 7.5 MG TABLET: 7.5 | 30 days supply | Qty: 30 | Fill #3

## 2017-03-27 ENCOUNTER — Other Ambulatory Visit: Payer: Self-pay

## 2017-03-27 DIAGNOSIS — E119 Type 2 diabetes mellitus without complications: Secondary | ICD-10-CM

## 2017-03-27 DIAGNOSIS — Z794 Long term (current) use of insulin: Principal | ICD-10-CM

## 2017-03-27 MED ORDER — INSULIN ASPART 100 UNIT/ML ~~LOC~~ SOLN: SUBCUTANEOUS | 3 refills | Status: DC

## 2017-03-27 MED ORDER — ALBUTEROL SULFATE HFA 108 (90 BASE) MCG/ACT IN AERS: 2.0000 | INHALATION_SPRAY | RESPIRATORY_TRACT | 3 refills | Status: DC | PRN

## 2017-03-27 MED ORDER — INSULIN DETEMIR 100 UNIT/ML FLEXPEN: 8.0000 [IU] | PEN_INJECTOR | Freq: Every day | SUBCUTANEOUS | 3 refills | Status: DC

## 2017-04-17 ENCOUNTER — Other Ambulatory Visit: Payer: Self-pay | Admitting: Pharmacist

## 2017-04-17 MED ORDER — INSULIN DETEMIR 100 UNIT/ML FLEXPEN: 8.0000 [IU] | PEN_INJECTOR | Freq: Every day | SUBCUTANEOUS | 3 refills | Status: DC

## 2017-04-17 MED FILL — AMLODIPINE BESYLATE 10 MG T: 10 | 30 days supply | Qty: 30 | Fill #1

## 2017-04-17 MED FILL — MELOXICAM 7.5 MG TABLET: 7.5 | 30 days supply | Qty: 30 | Fill #4

## 2017-04-17 MED FILL — ?ATORVASTATIN 20 MG TABLET: 20 | 30 days supply | Qty: 30 | Fill #1

## 2017-04-17 MED FILL — METFORMIN HCL ER 500 MG TAB: 500 | 30 days supply | Qty: 120 | Fill #1

## 2017-04-18 MED FILL — !LEVEMIR 100 UNITS/ML VIAL: 100/ML | 42 days supply | Qty: 10 | Fill #0

## 2017-04-21 MED FILL — **SYMBICORT 80-4.5 MCG INHA: 80-4.5 | 30 days supply | Qty: 1 | Fill #2

## 2017-05-17 MED FILL — MELOXICAM 7.5 MG TABLET: 7.5 | 30 days supply | Qty: 30 | Fill #5

## 2017-05-17 MED FILL — METFORMIN HCL ER 500 MG TAB: 500 | 30 days supply | Qty: 120 | Fill #2

## 2017-05-17 MED FILL — AMLODIPINE BESYLATE 10 MG T: 10 | 30 days supply | Qty: 30 | Fill #2

## 2017-05-17 MED FILL — ?ATORVASTATIN 20 MG TABLET: 20 | 30 days supply | Qty: 30 | Fill #2

## 2017-05-17 MED FILL — TRUE METRIX TEST STRIP: 25 days supply | Qty: 100 | Fill #4

## 2017-05-30 ENCOUNTER — Emergency Department (HOSPITAL_COMMUNITY): Payer: Self-pay

## 2017-05-30 ENCOUNTER — Emergency Department (HOSPITAL_COMMUNITY)
Admission: EM | Admit: 2017-05-30 | Discharge: 2017-05-30 | Disposition: A | Discharge status: Home / Self Care | Payer: Self-pay | Attending: Emergency Medicine | Admitting: Emergency Medicine

## 2017-05-30 ENCOUNTER — Encounter (HOSPITAL_COMMUNITY): Payer: Self-pay | Admitting: Emergency Medicine

## 2017-05-30 DIAGNOSIS — Z7982 Long term (current) use of aspirin: Secondary | ICD-10-CM | POA: Insufficient documentation

## 2017-05-30 DIAGNOSIS — M25551 Pain in right hip: Secondary | ICD-10-CM | POA: Insufficient documentation

## 2017-05-30 DIAGNOSIS — R112 Nausea with vomiting, unspecified: Secondary | ICD-10-CM | POA: Insufficient documentation

## 2017-05-30 DIAGNOSIS — R197 Diarrhea, unspecified: Secondary | ICD-10-CM | POA: Insufficient documentation

## 2017-05-30 DIAGNOSIS — Z79899 Other long term (current) drug therapy: Secondary | ICD-10-CM | POA: Insufficient documentation

## 2017-05-30 DIAGNOSIS — Z87891 Personal history of nicotine dependence: Secondary | ICD-10-CM | POA: Insufficient documentation

## 2017-05-30 DIAGNOSIS — R300 Dysuria: Secondary | ICD-10-CM | POA: Insufficient documentation

## 2017-05-30 DIAGNOSIS — Z794 Long term (current) use of insulin: Secondary | ICD-10-CM | POA: Insufficient documentation

## 2017-05-30 DIAGNOSIS — J449 Chronic obstructive pulmonary disease, unspecified: Secondary | ICD-10-CM | POA: Insufficient documentation

## 2017-05-30 DIAGNOSIS — E119 Type 2 diabetes mellitus without complications: Secondary | ICD-10-CM | POA: Insufficient documentation

## 2017-05-30 DIAGNOSIS — I10 Essential (primary) hypertension: Secondary | ICD-10-CM | POA: Insufficient documentation

## 2017-05-30 DIAGNOSIS — R109 Unspecified abdominal pain: Secondary | ICD-10-CM | POA: Insufficient documentation

## 2017-05-30 LAB — URINALYSIS, ROUTINE W REFLEX MICROSCOPIC
BILIRUBIN URINE: NEGATIVE
Glucose, UA: >=500 mg/dL — AB
KETONES UR: 20 mg/dL — AB
Leukocytes, UA: NEGATIVE
Nitrite: NEGATIVE
SPECIFIC GRAVITY, URINE: 1.022 (ref 1.005–1.030)
pH: 5 (ref 5.0–8.0)

## 2017-05-30 LAB — CBC
HEMATOCRIT: 43.1 % (ref 36.0–46.0)
Hemoglobin: 15 g/dL (ref 12.0–15.0)
MCH: 29 pg (ref 26.0–34.0)
MCHC: 34.8 g/dL (ref 30.0–36.0)
MCV: 83.4 fL (ref 78.0–100.0)
PLATELETS: 273 10*3/uL (ref 150–400)
RBC: 5.17 MIL/uL — AB (ref 3.87–5.11)
RDW: 14 % (ref 11.5–15.5)
WBC: 10.2 10*3/uL (ref 4.0–10.5)

## 2017-05-30 LAB — COMPREHENSIVE METABOLIC PANEL
ALT: 21 U/L (ref 14–54)
AST: 23 U/L (ref 15–41)
Albumin: 5.2 g/dL — ABNORMAL HIGH (ref 3.5–5.0)
Alkaline Phosphatase: 98 U/L (ref 38–126)
Anion gap: 14 (ref 5–15)
BUN: 26 mg/dL — AB (ref 6–20)
CHLORIDE: 95 mmol/L — AB (ref 101–111)
CO2: 26 mmol/L (ref 22–32)
CREATININE: 0.84 mg/dL (ref 0.44–1.00)
Calcium: 10.2 mg/dL (ref 8.9–10.3)
GFR calc Af Amer: >60 mL/min (ref >60)
GFR calc non Af Amer: >60 mL/min (ref >60)
Glucose, Bld: 315 mg/dL — ABNORMAL HIGH (ref 65–99)
POTASSIUM: 4 mmol/L (ref 3.5–5.1)
SODIUM: 135 mmol/L (ref 135–145)
Total Bilirubin: 0.7 mg/dL (ref 0.3–1.2)
Total Protein: 8.4 g/dL — ABNORMAL HIGH (ref 6.5–8.1)

## 2017-05-30 LAB — LIPASE, BLOOD: LIPASE: 29 U/L (ref 11–51)

## 2017-05-30 MED ORDER — ONDANSETRON 4 MG PO TBDP
4.0000 mg | ORAL_TABLET | Freq: Once | ORAL | Status: DC | PRN
  Filled 2017-05-30: qty 1

## 2017-05-30 MED ORDER — PROMETHAZINE HCL 25 MG PO TABS: 25.0000 mg | ORAL_TABLET | Freq: Four times a day (QID) | ORAL | 0 refills | Status: DC | PRN

## 2017-05-30 MED ORDER — ONDANSETRON HCL 4 MG/2ML IJ SOLN
4.0000 mg | Freq: Once | INTRAMUSCULAR | Status: AC
  Administered 2017-05-30: 4 mg via INTRAVENOUS
  Filled 2017-05-30: qty 2

## 2017-05-30 MED ORDER — SODIUM CHLORIDE 0.9 % IV BOLUS (SEPSIS)
1000.0000 mL | Freq: Once | INTRAVENOUS | Status: AC
  Administered 2017-05-30: 1000 mL via INTRAVENOUS

## 2017-05-30 MED ORDER — KETOROLAC TROMETHAMINE 30 MG/ML IJ SOLN
30.0000 mg | Freq: Once | INTRAMUSCULAR | Status: AC
  Administered 2017-05-30: 30 mg via INTRAVENOUS
  Filled 2017-05-30: qty 1

## 2017-05-30 MED ORDER — CYCLOBENZAPRINE HCL 10 MG PO TABS: 10.0000 mg | ORAL_TABLET | Freq: Two times a day (BID) | ORAL | 0 refills | Status: DC | PRN

## 2017-05-30 MED ORDER — MORPHINE SULFATE (PF) 4 MG/ML IV SOLN
4.0000 mg | Freq: Once | INTRAVENOUS | Status: AC
  Administered 2017-05-30: 4 mg via INTRAVENOUS
  Filled 2017-05-30: qty 1

## 2017-05-30 NOTE — ED Provider Notes (Signed)
[facility] DEPT Provider Note   CSN: [id number] Arrival date & time: 05/30/17  6144     History   Chief Complaint Chief Complaint  Patient presents with  . right side pain  . Hip Pain  . Emesis  . Dysuria    HPI Nicole Yates is a 61 y.o. female.  HPI   Yesterday afternoon 2PM developed right flank pain radiating into the right hip, history of hip surgery, hurts in hip and lower right back.  Pain is constant, steady, but then waxes and wanes.  If laying on left side it is better, if on right side it is too severe.   No radiation to legs.  (does note sciatica on the other side)  No dysuria but has pain in back with urination, squeezing pain.  No blood in urine.  Nausea and vomiting starting last night 7-8 times.  Diarrhea 4 times.  No abdominal pain.  Little bit of fever last night almost 100.0.    Past Medical History:  Diagnosis Date  . Anxiety   . Diabetes mellitus without complication ([place])   . Hypertension     Patient Active Problem List   Diagnosis Date Noted  . COPD (chronic obstructive pulmonary disease) ([place]) 08/17/2016  . Essential hypertension 03/16/2015  . Arthritis 03/16/2015  . HLD (hyperlipidemia) 03/16/2015  . DM (diabetes mellitus), type 2, uncontrolled ([place]) 07/03/2014  . Leukocytosis, unspecified 06/10/2014  . Hypokalemia 06/10/2014  . CAP (community acquired pneumonia) 06/10/2014  . DKA, type 2 ([place]) 06/09/2014  . Hyperglycemia 06/09/2014    Past Surgical History:  Procedure Laterality Date  . ABDOMINAL HYSTERECTOMY    . FRACTURE SURGERY      OB History    Gravida Para Term Preterm AB Living   0 0 0 0 0 0   SAB TAB Ectopic Multiple Live Births   0 0 0 0 0       Home Medications    Prior to Admission medications   Medication Sig Start Date End Date Taking? Authorizing Provider  albuterol (VENTOLIN HFA) 108 (90 Base) MCG/ACT inhaler Inhale 2 puffs into the lungs every 4 (four) hours as needed for wheezing or shortness of breath.  03/27/17  Yes Alfonse Spruce, FNP  aspirin 81 MG tablet Take 1 tablet (81 mg total) by mouth daily. 03/13/17  Yes Hairston, Maylon Peppers, FNP  atorvastatin (LIPITOR) 20 MG tablet Take 1 tablet (20 mg total) by mouth daily. 03/13/17  Yes Hairston, Toy Baker R, FNP  Blood Glucose Monitoring Suppl (TRUE METRIX METER) W/DEVICE KIT 1 each by Does not apply route 4 (four) times daily -  before meals and at bedtime. 07/22/15  Yes Lance Bosch, NP  budesonide-formoterol (SYMBICORT) 80-4.5 MCG/ACT inhaler Inhale 2 puffs into the lungs 2 (two) times daily. 08/17/16  Yes Langeland, Dawn T, MD  glucose blood (TRUE METRIX BLOOD GLUCOSE TEST) test strip Use as instructed 11/07/16  Yes Langeland, Dawn T, MD  insulin aspart (NOVOLOG) 100 UNIT/ML injection If CBG greater than 150, inject 3 Units into the skin 3( three) times daily with meals. 03/27/17  Yes Hairston, Maylon Peppers, FNP  Insulin Detemir (LEVEMIR) 100 UNIT/ML Pen Inject 8 Units into the skin daily at 10 pm. 04/17/17  Yes Hairston, Mandesia R, FNP  Insulin Pen Needle (ULTICARE MICRO PEN NEEDLES) 32G X 4 MM MISC 1 applicator by Does not apply route at bedtime. 08/17/16  Yes Langeland, Dawn T, MD  Insulin Syringe-Needle U-100 30G X 3/8" 0.3 ML MISC  1 Syringe by Does not apply route 4 (four) times daily. 06/12/14  Yes Gherghe, Vella Redhead, MD  lisinopril (PRINIVIL,ZESTRIL) 10 MG tablet Take 1 tablet (10 mg total) by mouth daily. 12/28/16  Yes Tresa Garter, MD  loratadine (CLARITIN) 10 MG tablet Take 10 mg by mouth daily.   Yes [provider]  meloxicam (MOBIC) 7.5 MG tablet Take 1 tablet (7.5 mg total) by mouth daily as needed for pain. Take w/ food. 12/14/16  Yes Langeland, Dawn T, MD  metFORMIN (GLUCOPHAGE-XR) 500 MG 24 hr tablet Take 2 tablets (1,000 mg total) by mouth 2 (two) times daily. 03/13/17  Yes Hairston, Maylon Peppers, FNP  amLODipine (NORVASC) 10 MG tablet Take 1 tablet (10 mg total) by mouth daily. Patient not taking: Reported on 05/30/2017  03/13/17   Alfonse Spruce, FNP  cyclobenzaprine (FLEXERIL) 10 MG tablet Take 1 tablet (10 mg total) by mouth 2 (two) times daily as needed for muscle spasms. 05/30/17   Gareth Morgan, MD  escitalopram (LEXAPRO) 10 MG tablet Take 1 tablet (10 mg total) by mouth daily. Patient not taking: Reported on 05/30/2017 03/13/17   Alfonse Spruce, FNP  guaiFENesin (MUCINEX) 600 MG 12 hr tablet Take 1 tablet (600 mg total) by mouth 2 (two) times daily. Patient not taking: Reported on 05/30/2017 03/13/17   Alfonse Spruce, FNP  promethazine (PHENERGAN) 25 MG tablet Take 1 tablet (25 mg total) by mouth every 6 (six) hours as needed for nausea or vomiting. 05/30/17   Gareth Morgan, MD  TRUEPLUS LANCETS 28G MISC 1 each by Does not apply route 4 (four) times daily -  before meals and at bedtime. Patient not taking: Reported on 05/30/2017 08/17/16   Maren Reamer, MD    Family History Family History  Problem Relation Age of Onset  . Breast cancer Mother   . Cancer Mother        uterine and liver  . Diabetes Father   . Hypertension Brother     Social History Social History  Substance Use Topics  . Smoking status: Former Research scientist (life sciences)  . Smokeless tobacco: Never Used  . Alcohol use Yes     Comment: social     Allergies   Codeine and Tetanus toxoids   Review of Systems Review of Systems  Constitutional: Negative for fever.  HENT: Negative for sore throat.   Eyes: Negative for visual disturbance.  Respiratory: Negative for cough and shortness of breath.   Cardiovascular: Negative for chest pain.  Gastrointestinal: Positive for diarrhea, nausea and vomiting. Negative for abdominal pain and constipation.  Genitourinary: Positive for flank pain. Negative for difficulty urinating.  Musculoskeletal: Positive for arthralgias and back pain. Negative for neck pain.  Skin: Negative for rash.  Neurological: Negative for syncope and headaches.     Physical Exam Updated Vital Signs BP  126/87 (BP Location: Right Arm)   Pulse 87   Temp 98.4 F (36.9 C) (Oral)   Resp 18   SpO2 99%   Physical Exam  Constitutional: She is oriented to person, place, and time. She appears well-developed and well-nourished. She appears distressed (pain).  HENT:  Head: Normocephalic and atraumatic.  Eyes: Conjunctivae and EOM are normal.  Neck: Normal range of motion.  Cardiovascular: Normal rate, regular rhythm, normal heart sounds and intact distal pulses.  Exam reveals no gallop and no friction rub.   No murmur heard. Pulmonary/Chest: Effort normal and breath sounds normal. No respiratory distress. She has no wheezes. She has no  rales.  Abdominal: Soft. She exhibits no distension. There is no tenderness. There is no guarding.  Musculoskeletal: She exhibits no edema or tenderness.  Neurological: She is alert and oriented to person, place, and time.  Skin: Skin is warm and dry. No rash noted. She is not diaphoretic. No erythema.  Nursing note and vitals reviewed.    ED Treatments / Results  Labs (all labs ordered are listed, but only abnormal results are displayed) Labs Reviewed  COMPREHENSIVE METABOLIC PANEL - Abnormal; Notable for the following:       Result Value   Chloride 95 (*)    Glucose, Bld 315 (*)    BUN 26 (*)    Total Protein 8.4 (*)    Albumin 5.2 (*)    All other components within normal limits  CBC - Abnormal; Notable for the following:    RBC 5.17 (*)    All other components within normal limits  URINALYSIS, ROUTINE W REFLEX MICROSCOPIC - Abnormal; Notable for the following:    APPearance HAZY (*)    Glucose, UA >=500 (*)    Hgb urine dipstick SMALL (*)    Ketones, ur 20 (*)    Protein, ur >=300 (*)    Bacteria, UA FEW (*)    Squamous Epithelial / LPF 0-5 (*)    All other components within normal limits  LIPASE, BLOOD    EKG  EKG Interpretation None       Radiology Ct Renal Stone Study  Result Date: 05/30/2017 CLINICAL DATA:  Right flank pain.  EXAM: CT ABDOMEN AND PELVIS WITHOUT CONTRAST TECHNIQUE: Multidetector CT imaging of the abdomen and pelvis was performed following the standard protocol without IV contrast. COMPARISON:  CT scan dated 11/28/2007 FINDINGS: Lower chest: Slight peribronchial thickening at the lung bases. Heart size is normal. Hepatobiliary: No focal liver abnormality is seen. Status post cholecystectomy. No biliary dilatation. Pancreas: Unremarkable. No pancreatic ductal dilatation or surrounding inflammatory changes. Spleen: Normal in size without focal abnormality. Adrenals/Urinary Tract: Bilateral adrenal adenomas. The largest is on the left measures 2.3 cm, increased in size since 2009. Two small stones in the mid to lower right kidney. There is no hydronephrosis. No ureteral calculi. Bladder appears normal. Stomach/Bowel: Stomach is within normal limits. Appendix appears normal. No evidence of bowel wall thickening, distention, or inflammatory changes. Vascular/Lymphatic: Aortic atherosclerosis. No enlarged abdominal or pelvic lymph nodes. Reproductive: Uterus and left ovary appear to have been removed. 2.7 cm right ovary. Other: No abdominal wall hernia or abnormality. No abdominopelvic ascites. Musculoskeletal: No acute or significant osseous findings. IMPRESSION: 1. No acute abnormalities. 2. 2 small stones in the right kidney without evidence of ureteral calculi or hydronephrosis. 3. Aortic atherosclerosis. Electronically Signed   By: Lorriane Shire M.D.   On: 05/30/2017 12:14    Procedures Procedures (including critical care time)  Medications Ordered in ED Medications  morphine 4 MG/ML injection 4 mg (4 mg Intravenous Given 05/30/17 1118)  ketorolac (TORADOL) 30 MG/ML injection 30 mg (30 mg Intravenous Given 05/30/17 1118)  sodium chloride 0.9 % bolus 1,000 mL (0 mLs Intravenous Stopped 05/30/17 1444)  sodium chloride 0.9 % bolus 1,000 mL (0 mLs Intravenous Stopped 05/30/17 1444)  ondansetron (ZOFRAN) injection 4 mg  (4 mg Intravenous Given 05/30/17 1118)  ketorolac (TORADOL) 30 MG/ML injection 30 mg (30 mg Intravenous Given 05/30/17 1453)     Initial Impression / Assessment and Plan / ED Course  I have reviewed the triage vital signs and the nursing notes.  Pertinent labs & imaging results that were available during my care of the patient were reviewed by me and considered in my medical decision making (see chart for details).     61 year old female with a history of hypertension, diabetes, hyperlipidemia, COPD, presents with concern for right sided flank pain with radiation to the right hip.CT stone study shows no sign of nephrolithiasis or other acute abnormalities. Urinalysis shows no sign of infection. Mild ketones likely secondary to starvation ketosis, bicarbonate is within normal limits,doubt this represents DKA. Reports hip pain, however has full range of motion, no fever, doubt septic hip.  Feel pain likely muscular, possible disc herniation. No signs of cauda equina.  Discussed recommendation for outpatient follow up, given zofran, muscle relaxant, recommend PCP follow up.   Final Clinical Impressions(s) / ED Diagnoses   Final diagnoses:  Right flank pain, suspect muscular strain or herniated disc    New Prescriptions Discharge Medication List as of 05/30/2017  2:50 PM    START taking these medications   Details  cyclobenzaprine (FLEXERIL) 10 MG tablet Take 1 tablet (10 mg total) by mouth 2 (two) times daily as needed for muscle spasms., Starting Tue 05/30/2017, Print    promethazine (PHENERGAN) 25 MG tablet Take 1 tablet (25 mg total) by mouth every 6 (six) hours as needed for nausea or vomiting., Starting Tue 05/30/2017, Print         Gareth Morgan, MD 05/30/17 1900

## 2017-05-30 NOTE — ED Triage Notes (Signed)
Patient states yesterday she was vacuuming and started having right side pain that radiates to hip, n/v/d and dysuria.

## 2017-05-30 NOTE — ED Notes (Signed)
Patient vomiting in triage, unable to get oral temperature.

## 2017-05-30 NOTE — Discharge Instructions (Signed)
Take Tylenol 1000 mg 4 times a day for 1 week. This is the maximum dose of Tylenol (acetaminophen) you can take from all sources. Please check other over-the-counter medications and prescriptions to ensure you are not taking other medications that contain acetaminophen.  You may also take ibuprofen 400 mg 6 times a day alternating with or at the same time as tylenol.  °

## 2017-06-15 MED FILL — $VENTOLIN HFA 18G INHALER: 108 (90 BAS | 16 days supply | Qty: 18 | Fill #2

## 2017-06-19 MED FILL — MELOXICAM 7.5 MG TABLET: 7.5 | 30 days supply | Qty: 30 | Fill #6

## 2017-06-19 MED FILL — TRUE METRIX TEST STRIP: 25 days supply | Qty: 100 | Fill #5

## 2017-06-19 MED FILL — AMLODIPINE BESYLATE 10 MG T: 10 | 30 days supply | Qty: 30 | Fill #3

## 2017-06-19 MED FILL — ?ATORVASTATIN 20 MG TABLET: 20 | 30 days supply | Qty: 30 | Fill #3

## 2017-06-19 MED FILL — METFORMIN HCL ER 500 MG TAB: 500 | 30 days supply | Qty: 120 | Fill #3

## 2017-07-25 ENCOUNTER — Ambulatory Visit: Payer: Self-pay | Attending: Family Medicine | Admitting: Family Medicine

## 2017-07-25 ENCOUNTER — Encounter: Payer: Self-pay | Admitting: Family Medicine

## 2017-07-25 VITALS — BP 164/86 | HR 75 | Temp 98.4°F | Resp 18 | Ht 63.0 in | Wt 128.2 lb

## 2017-07-25 DIAGNOSIS — E1169 Type 2 diabetes mellitus with other specified complication: Secondary | ICD-10-CM

## 2017-07-25 DIAGNOSIS — Z794 Long term (current) use of insulin: Secondary | ICD-10-CM | POA: Insufficient documentation

## 2017-07-25 DIAGNOSIS — Z7982 Long term (current) use of aspirin: Secondary | ICD-10-CM | POA: Insufficient documentation

## 2017-07-25 DIAGNOSIS — Z7951 Long term (current) use of inhaled steroids: Secondary | ICD-10-CM | POA: Insufficient documentation

## 2017-07-25 DIAGNOSIS — J4540 Moderate persistent asthma, uncomplicated: Secondary | ICD-10-CM | POA: Insufficient documentation

## 2017-07-25 DIAGNOSIS — E119 Type 2 diabetes mellitus without complications: Secondary | ICD-10-CM | POA: Insufficient documentation

## 2017-07-25 DIAGNOSIS — E1110 Type 2 diabetes mellitus with ketoacidosis without coma: Secondary | ICD-10-CM

## 2017-07-25 DIAGNOSIS — F419 Anxiety disorder, unspecified: Secondary | ICD-10-CM | POA: Insufficient documentation

## 2017-07-25 DIAGNOSIS — J449 Chronic obstructive pulmonary disease, unspecified: Secondary | ICD-10-CM | POA: Insufficient documentation

## 2017-07-25 DIAGNOSIS — E785 Hyperlipidemia, unspecified: Secondary | ICD-10-CM | POA: Insufficient documentation

## 2017-07-25 DIAGNOSIS — I10 Essential (primary) hypertension: Secondary | ICD-10-CM | POA: Insufficient documentation

## 2017-07-25 DIAGNOSIS — Z79899 Other long term (current) drug therapy: Secondary | ICD-10-CM | POA: Insufficient documentation

## 2017-07-25 DIAGNOSIS — Z87891 Personal history of nicotine dependence: Secondary | ICD-10-CM | POA: Insufficient documentation

## 2017-07-25 LAB — GLUCOSE, POCT (MANUAL RESULT ENTRY): POC GLUCOSE: 225 mg/dL — AB (ref 70–99)

## 2017-07-25 LAB — POCT GLYCOSYLATED HEMOGLOBIN (HGB A1C): HEMOGLOBIN A1C: 7.6

## 2017-07-25 MED ORDER — BUDESONIDE-FORMOTEROL FUMARATE 80-4.5 MCG/ACT IN AERO: 2.0000 | INHALATION_SPRAY | Freq: Two times a day (BID) | RESPIRATORY_TRACT | 3 refills | Status: DC

## 2017-07-25 MED ORDER — ALBUTEROL SULFATE HFA 108 (90 BASE) MCG/ACT IN AERS: 2.0000 | INHALATION_SPRAY | RESPIRATORY_TRACT | 3 refills | Status: DC | PRN

## 2017-07-25 MED ORDER — LISINOPRIL 5 MG PO TABS: 5.0000 mg | ORAL_TABLET | Freq: Every day | ORAL | 1 refills | Status: DC

## 2017-07-25 MED ORDER — AMLODIPINE BESYLATE 10 MG PO TABS: 10.0000 mg | ORAL_TABLET | Freq: Every day | ORAL | 1 refills | Status: DC

## 2017-07-25 MED ORDER — ATORVASTATIN CALCIUM 20 MG PO TABS: 20.0000 mg | ORAL_TABLET | Freq: Every day | ORAL | 1 refills | Status: DC

## 2017-07-25 MED ORDER — METFORMIN HCL ER 500 MG PO TB24: 1000.0000 mg | ORAL_TABLET | Freq: Two times a day (BID) | ORAL | 3 refills | Status: DC

## 2017-07-25 MED FILL — METFORMIN HCL ER 500 MG TAB: 500 | 30 days supply | Qty: 120 | Fill #0

## 2017-07-25 MED FILL — AMLODIPINE BESYLATE 10 MG T: 10 | 30 days supply | Qty: 30 | Fill #0

## 2017-07-25 MED FILL — !VENTOLIN HFA INHALER: 108 (90 BAS | 25 days supply | Qty: 18 | Fill #0

## 2017-07-25 MED FILL — ?ATORVASTATIN 20 MG TABLET: 20 | 30 days supply | Qty: 30 | Fill #0

## 2017-07-25 MED FILL — ?LISINOPRIL 5 MG TABLET: 5 | 30 days supply | Qty: 30 | Fill #0

## 2017-07-25 NOTE — Progress Notes (Deleted)
Subjective:  Patient ID: Nicole Yates, female    DOB: 01/11/1956  Age: 61 y.o. MRN: [id number]  CC: No chief complaint on file.   HPI Nicole Yates presents for      Outpatient Medications Prior to Visit  Medication Sig Dispense Refill  . albuterol (VENTOLIN HFA) 108 (90 Base) MCG/ACT inhaler Inhale 2 puffs into the lungs every 4 (four) hours as needed for wheezing or shortness of breath. 54 Inhaler 3  . amLODipine (NORVASC) 10 MG tablet Take 1 tablet (10 mg total) by mouth daily. (Patient not taking: Reported on 05/30/2017) 90 tablet 1  . aspirin 81 MG tablet Take 1 tablet (81 mg total) by mouth daily. 90 tablet 3  . atorvastatin (LIPITOR) 20 MG tablet Take 1 tablet (20 mg total) by mouth daily. 90 tablet 1  . Blood Glucose Monitoring Suppl (TRUE METRIX METER) W/DEVICE KIT 1 each by Does not apply route 4 (four) times daily -  before meals and at bedtime. 1 kit 0  . budesonide-formoterol (SYMBICORT) 80-4.5 MCG/ACT inhaler Inhale 2 puffs into the lungs 2 (two) times daily. 1 Inhaler 3  . cyclobenzaprine (FLEXERIL) 10 MG tablet Take 1 tablet (10 mg total) by mouth 2 (two) times daily as needed for muscle spasms. 20 tablet 0  . escitalopram (LEXAPRO) 10 MG tablet Take 1 tablet (10 mg total) by mouth daily. (Patient not taking: Reported on 05/30/2017) 30 tablet 3  . glucose blood (TRUE METRIX BLOOD GLUCOSE TEST) test strip Use as instructed 100 each 12  . guaiFENesin (MUCINEX) 600 MG 12 hr tablet Take 1 tablet (600 mg total) by mouth 2 (two) times daily. (Patient not taking: Reported on 05/30/2017) 14 tablet 0  . insulin aspart (NOVOLOG) 100 UNIT/ML injection If CBG greater than 150, inject 3 Units into the skin 3( three) times daily with meals. 30 mL 3  . Insulin Detemir (LEVEMIR) 100 UNIT/ML Pen Inject 8 Units into the skin daily at 10 pm. 10 mL 3  . Insulin Pen Needle (ULTICARE MICRO PEN NEEDLES) 32G X 4 MM MISC 1 applicator by Does not apply route at bedtime. 100 each 2  . Insulin  Syringe-Needle U-100 30G X 3/8" 0.3 ML MISC 1 Syringe by Does not apply route 4 (four) times daily. 120 each 1  . lisinopril (PRINIVIL,ZESTRIL) 10 MG tablet Take 1 tablet (10 mg total) by mouth daily. 30 tablet 2  . loratadine (CLARITIN) 10 MG tablet Take 10 mg by mouth daily.    . meloxicam (MOBIC) 7.5 MG tablet Take 1 tablet (7.5 mg total) by mouth daily as needed for pain. Take w/ food. 60 tablet 3  . metFORMIN (GLUCOPHAGE-XR) 500 MG 24 hr tablet Take 2 tablets (1,000 mg total) by mouth 2 (two) times daily. 120 tablet 3  . promethazine (PHENERGAN) 25 MG tablet Take 1 tablet (25 mg total) by mouth every 6 (six) hours as needed for nausea or vomiting. 30 tablet 0  . TRUEPLUS LANCETS 28G MISC 1 each by Does not apply route 4 (four) times daily -  before meals and at bedtime. (Patient not taking: Reported on 05/30/2017) 100 each 5   Facility-Administered Medications Prior to Visit  Medication Dose Route Frequency Provider Last Rate Last Dose  . ipratropium-albuterol (DUONEB) 0.5-2.5 (3) MG/3ML nebulizer solution 3 mL  3 mL Nebulization Q20 Min PRN Hairston, Mandesia R, FNP   3 mL at 03/13/17 1021    ROS Review of Systems  Review of Systems - {ros [id number]}  Objective:  BP (!) 176/77 (BP Location: Left Arm, Patient Position: Sitting, Cuff Size: Normal)   Pulse 75   Temp 98.4 F (36.9 C) (Oral)   Resp 18   Ht 5' 3" (1.6 m)   Wt 128 lb 3.2 oz (58.2 kg)   SpO2 99%   BMI 22.71 kg/m   BP/Weight 07/25/2017 05/30/2017 10/16/7865  Systolic BP 672 094 709  Diastolic BP 77 87 73  Wt. (Lbs) 128.2 - 126.4  BMI 22.71 - 22.39     Physical Exam   Assessment & Plan:   Problem List Items Addressed This Visit      Endocrine   DKA, type 2 ([place]) - Primary   Relevant Orders   Glucose (CBG) (Completed)   HgB A1c (Completed)      No orders of the defined types were placed in this encounter.   Follow-up: No Follow-up on file.   Alfonse Spruce FNP

## 2017-07-25 NOTE — Progress Notes (Signed)
JA 

## 2017-07-26 MED FILL — SYMBICORT 80-4.5 MCG INH: 80-4.5 | 30 days supply | Qty: 10 | Fill #0

## 2017-07-31 NOTE — Progress Notes (Signed)
Subjective:  Patient ID: Nicole Yates, female    DOB: 11/28/1955  Age: 61 y.o. MRN: [id number]  CC: Diabetes   HPI AKEMI OVERHOLSER presents for. History of diabetes, HTN, HLD, COPD, and anxiety. History of diabetes:Symptoms: none. Patient denies foot ulcerations, hyperglycemia, nausea, paresthesia of the feet, polydipsia, polyuria, visual disturbances and vomitting.  Evaluation to date has been included: fasting blood sugar, fasting lipid panel, hemoglobin A1C and microalbuminuria.  Home sugars: BGs are running  consistent with Hgb A1C. Treatment to date: Continued insulin which has been effective and Continued metformin which has been effective. History of hypertension:. She is exercising and is not adherent to low salt diet. She does not check BP at home. She is not taking lisinopril. Cardiac symptoms none. Patient denies chest pain, chest pressure/discomfort, claudication, dyspnea, fatigue, lower extremity edema, near-syncope, palpitations and syncope.  Cardiovascular risk factors: diabetes mellitus, dyslipidemia, hypertension and history of smoking/tobacco eposure. Use of agents associated with hypertension: NSAIDS. History of target organ damage: none.COPD/ Asthma: Patient reports non-productive cough. She denies increased inhaler use.  Patient currently is not on home oxygen therapy. History of anxiety:   She has the following symptoms: feelings of losing control, racing thoughts. Onset of symptoms was approximately 15 years ago, stable since that time. She denies current suicidal and homicidal ideation. She reports adherence with Lexapro medication with relief of symptoms.  She complains of the following side effects from the treatment: none.   Outpatient Medications Prior to Visit  Medication Sig Dispense Refill  . aspirin 81 MG tablet Take 1 tablet (81 mg total) by mouth daily. 90 tablet 3  . Blood Glucose Monitoring Suppl (TRUE METRIX METER) W/DEVICE KIT 1 each by Does not apply route 4  (four) times daily -  before meals and at bedtime. 1 kit 0  . cyclobenzaprine (FLEXERIL) 10 MG tablet Take 1 tablet (10 mg total) by mouth 2 (two) times daily as needed for muscle spasms. 20 tablet 0  . escitalopram (LEXAPRO) 10 MG tablet Take 1 tablet (10 mg total) by mouth daily. (Patient not taking: Reported on 05/30/2017) 30 tablet 3  . glucose blood (TRUE METRIX BLOOD GLUCOSE TEST) test strip Use as instructed 100 each 12  . guaiFENesin (MUCINEX) 600 MG 12 hr tablet Take 1 tablet (600 mg total) by mouth 2 (two) times daily. (Patient not taking: Reported on 05/30/2017) 14 tablet 0  . insulin aspart (NOVOLOG) 100 UNIT/ML injection If CBG greater than 150, inject 3 Units into the skin 3( three) times daily with meals. 30 mL 3  . Insulin Detemir (LEVEMIR) 100 UNIT/ML Pen Inject 8 Units into the skin daily at 10 pm. 10 mL 3  . Insulin Pen Needle (ULTICARE MICRO PEN NEEDLES) 32G X 4 MM MISC 1 applicator by Does not apply route at bedtime. 100 each 2  . Insulin Syringe-Needle U-100 30G X 3/8" 0.3 ML MISC 1 Syringe by Does not apply route 4 (four) times daily. 120 each 1  . loratadine (CLARITIN) 10 MG tablet Take 10 mg by mouth daily.    . meloxicam (MOBIC) 7.5 MG tablet Take 1 tablet (7.5 mg total) by mouth daily as needed for pain. Take w/ food. 60 tablet 3  . promethazine (PHENERGAN) 25 MG tablet Take 1 tablet (25 mg total) by mouth every 6 (six) hours as needed for nausea or vomiting. 30 tablet 0  . TRUEPLUS LANCETS 28G MISC 1 each by Does not apply route 4 (four) times daily -  before meals and at bedtime. (Patient not taking: Reported on 05/30/2017) 100 each 5  . albuterol (VENTOLIN HFA) 108 (90 Base) MCG/ACT inhaler Inhale 2 puffs into the lungs every 4 (four) hours as needed for wheezing or shortness of breath. 54 Inhaler 3  . amLODipine (NORVASC) 10 MG tablet Take 1 tablet (10 mg total) by mouth daily. (Patient not taking: Reported on 05/30/2017) 90 tablet 1  . atorvastatin (LIPITOR) 20 MG tablet  Take 1 tablet (20 mg total) by mouth daily. 90 tablet 1  . budesonide-formoterol (SYMBICORT) 80-4.5 MCG/ACT inhaler Inhale 2 puffs into the lungs 2 (two) times daily. 1 Inhaler 3  . lisinopril (PRINIVIL,ZESTRIL) 10 MG tablet Take 1 tablet (10 mg total) by mouth daily. 30 tablet 2  . metFORMIN (GLUCOPHAGE-XR) 500 MG 24 hr tablet Take 2 tablets (1,000 mg total) by mouth 2 (two) times daily. 120 tablet 3   Facility-Administered Medications Prior to Visit  Medication Dose Route Frequency Provider Last Rate Last Dose  . ipratropium-albuterol (DUONEB) 0.5-2.5 (3) MG/3ML nebulizer solution 3 mL  3 mL Nebulization Q20 Min PRN Hairston, Mandesia R, FNP   3 mL at 03/13/17 1021    ROS Review of Systems  Constitutional: Negative.   Eyes: Negative.   Respiratory: Negative.   Cardiovascular: Negative.   Gastrointestinal: Negative.   Musculoskeletal: Negative.   Skin: Negative.   Psychiatric/Behavioral: Negative for suicidal ideas.   Objective:  BP (!) 164/86 (BP Location: Left Arm, Cuff Size: Small)   Pulse 75   Temp 98.4 F (36.9 C) (Oral)   Resp 18   Ht '5\' 3"'$  (1.6 m)   Wt 128 lb 3.2 oz (58.2 kg)   SpO2 99%   BMI 22.71 kg/m   BP/Weight 07/25/2017 05/30/2017 3/57/0177  Systolic BP 939 030 092  Diastolic BP 86 87 73  Wt. (Lbs) 128.2 - 126.4  BMI 22.71 - 22.39    Physical Exam  Constitutional: She is oriented to person, place, and time.  Eyes: Conjunctivae are normal. Pupils are equal, round, and reactive to light.  Neck: Normal range of motion. Neck supple. No JVD present.  Cardiovascular: Normal rate, regular rhythm, normal heart sounds and intact distal pulses.  Pulmonary/Chest: Effort normal. She has wheezes.  Abdominal: Soft. Bowel sounds are normal. There is no tenderness.  Neurological: She is alert and oriented to person, place, and time.  Skin: Skin is warm and dry.  Psychiatric: She has a normal mood and affect. She expresses no homicidal and no suicidal ideation. She  expresses no suicidal plans and no homicidal plans. She is attentive.  Nursing note and vitals reviewed.     Assessment & Plan:   1. Type 2 diabetes mellitus without complication, with long-term current use of insulin (HCC)  - Glucose (CBG) - HgB A1c - metFORMIN (GLUCOPHAGE-XR) 500 MG 24 hr tablet; Take 2 tablets (1,000 mg total) 2 (two) times daily by mouth.  Dispense: 120 tablet; Refill: 3 - Ambulatory referral to Ophthalmology  2. Essential hypertension Schedule BP recheck in 2 weeks with nurse. If BP is greater than 90/60 (MAP 65 or greater) but not less than 130/80 may increase dose of lisinopril and recheck in another 2 weeks.  - amLODipine (NORVASC) 10 MG tablet; Take 1 tablet (10 mg total) daily by mouth.  Dispense: 90 tablet; Refill: 1 - lisinopril (PRINIVIL,ZESTRIL) 5 MG tablet; Take 1 tablet (5 mg total) daily by mouth.  Dispense: 90 tablet; Refill: 1  3. Moderate persistent asthma without complication  -  budesonide-formoterol (SYMBICORT) 80-4.5 MCG/ACT inhaler; Inhale 2 puffs 2 (two) times daily into the lungs.  Dispense: 1 Inhaler; Refill: 3 - albuterol (VENTOLIN HFA) 108 (90 Base) MCG/ACT inhaler; Inhale 2 puffs every 4 (four) hours as needed into the lungs for wheezing or shortness of breath.  Dispense: 54 Inhaler; Refill: 3  4. Hyperlipidemia associated with type 2 diabetes mellitus (HCC)  - atorvastatin (LIPITOR) 20 MG tablet; Take 1 tablet (20 mg total) daily by mouth.  Dispense: 90 tablet; Refill: 1     Follow-up: Return in about 2 weeks (around 08/08/2017) for BP check with Stacy.   Alfonse Spruce FNP

## 2017-08-08 ENCOUNTER — Encounter: Payer: Self-pay | Admitting: Pharmacist

## 2017-08-24 MED FILL — METFORMIN HCL ER 500 MG TAB: 500 | 30 days supply | Qty: 120 | Fill #1

## 2017-08-24 MED FILL — AMLODIPINE BESYLATE 10 MG T: 10 | 30 days supply | Qty: 30 | Fill #1

## 2017-08-24 MED FILL — ATORVASTATIN 20 MG TABLET: 20 | 30 days supply | Qty: 30 | Fill #1

## 2017-08-24 MED FILL — MELOXICAM 7.5 MG TABLET: 7.5 | 30 days supply | Qty: 30 | Fill #7

## 2017-08-25 MED FILL — !LEVEMIR 100 UNITS/ML VIAL: 100/ML | 42 days supply | Qty: 10 | Fill #1

## 2017-09-25 MED FILL — AMLODIPINE BESYLATE 10 MG T: 10 | 30 days supply | Qty: 30 | Fill #2

## 2017-09-25 MED FILL — METFORMIN HCL ER 500 MG TAB: 500 | 30 days supply | Qty: 120 | Fill #2

## 2017-09-25 MED FILL — ?ATORVASTATIN 20MG TABLET: 20 | 30 days supply | Qty: 30 | Fill #2

## 2017-10-25 MED FILL — METFORMIN HCL ER 500 MG TAB: 500 | 30 days supply | Qty: 120 | Fill #3

## 2017-10-25 MED FILL — AMLODIPINE BESYLATE 10 MG T: 10 | 30 days supply | Qty: 30 | Fill #3

## 2017-10-25 MED FILL — ?ATORVASTATIN 20MG TABL: 20 | 30 days supply | Qty: 30 | Fill #3

## 2017-10-30 ENCOUNTER — Other Ambulatory Visit: Payer: Self-pay | Admitting: Family Medicine

## 2017-10-30 DIAGNOSIS — Z760 Encounter for issue of repeat prescription: Secondary | ICD-10-CM

## 2017-10-30 MED ORDER — MELOXICAM 7.5 MG PO TABS: 7.5000 mg | ORAL_TABLET | Freq: Every day | ORAL | 1 refills | Status: DC | PRN

## 2017-10-30 MED FILL — MELOXICAM 7.5 MG TABLET: 7.5 | 30 days supply | Qty: 30 | Fill #0

## 2018-10-10 IMAGING — CT CT RENAL STONE PROTOCOL
2 of 3 series · 17 of 46 positions shown, 19 images · non-contrast
Comparison: CT scan dated 11/28/2007

CLINICAL DATA: Right flank pain.

EXAM:
CT ABDOMEN AND PELVIS WITHOUT CONTRAST
TECHNIQUE: Multidetector CT imaging of the abdomen and pelvis was performed
following the standard protocol without IV contrast.

[Series 3: coronal · coronal · 0.91mm/px · 3 of 112 slices shown]
[im 38/112  soft-tissue]
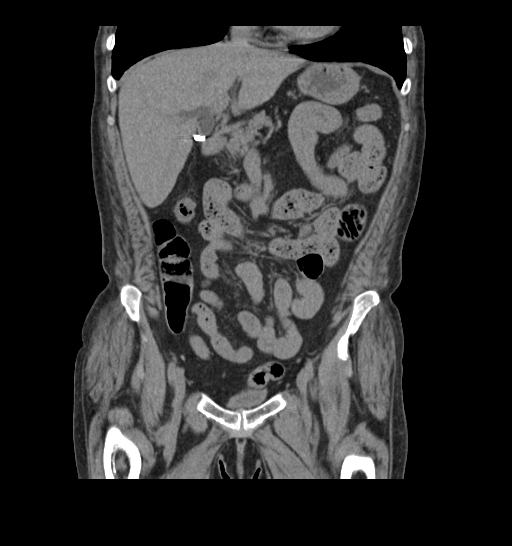
[im 50/112  soft-tissue]
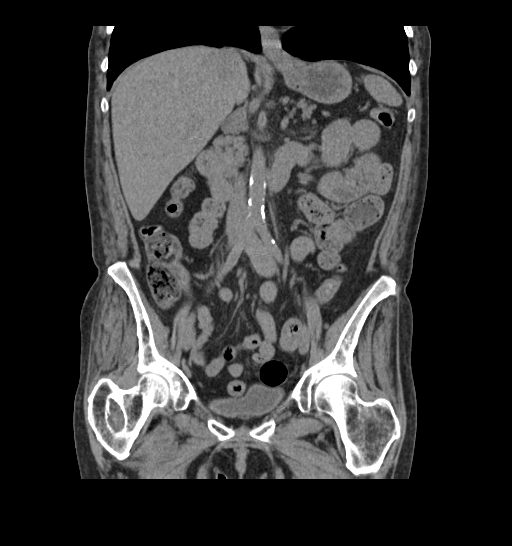
[im 62/112  soft-tissue]
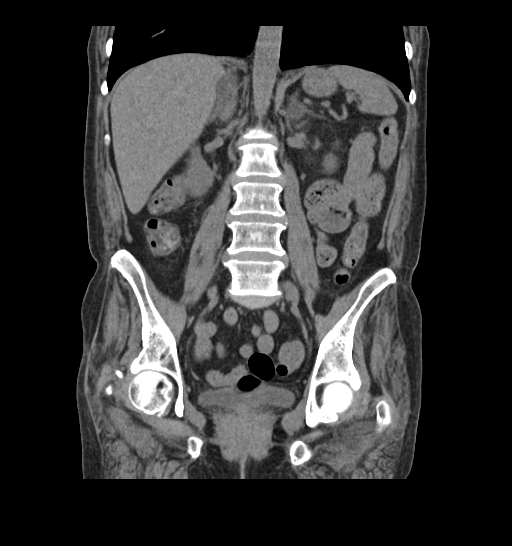

[Series 6: lung · axial · 0.79mm/px · z∈[+1438,+1518]mm · 14 of 46 slices shown, 16 images]
[im 3/46  soft-tissue]
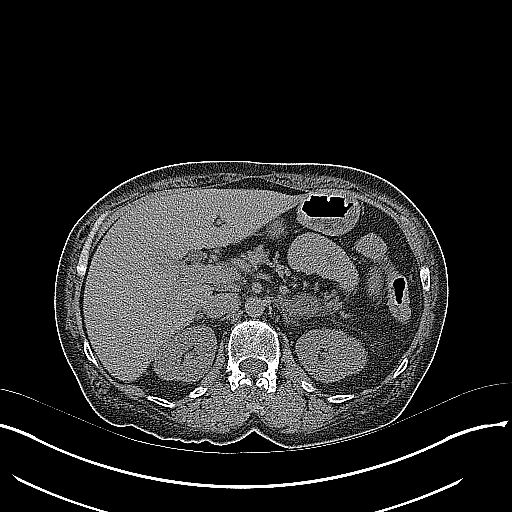
[im 3/46  bone]
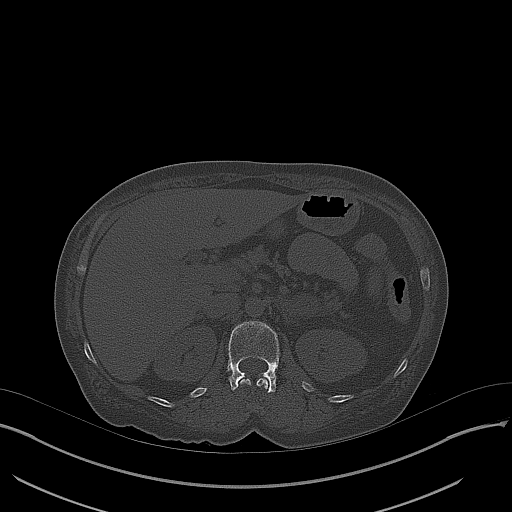
[im 6/46  soft-tissue]
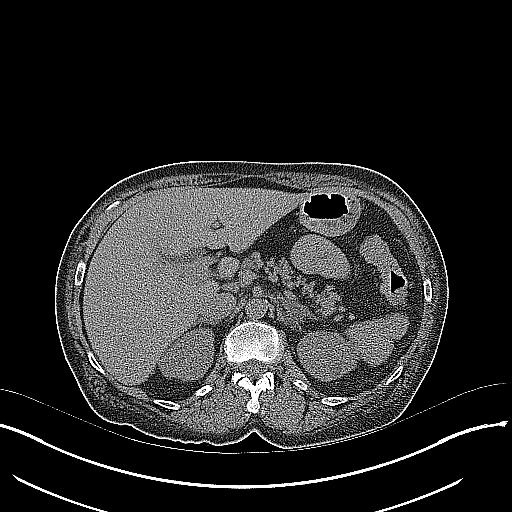
[im 9/46  soft-tissue]
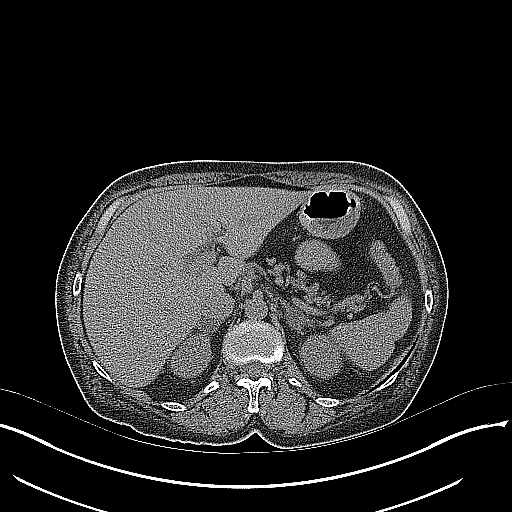
[im 12/46  soft-tissue]
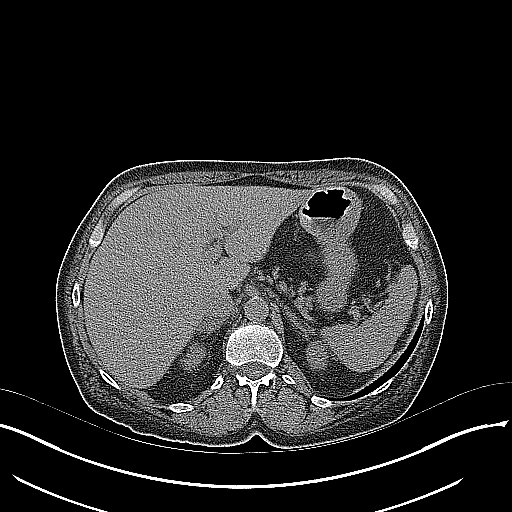
[im 15/46  soft-tissue]
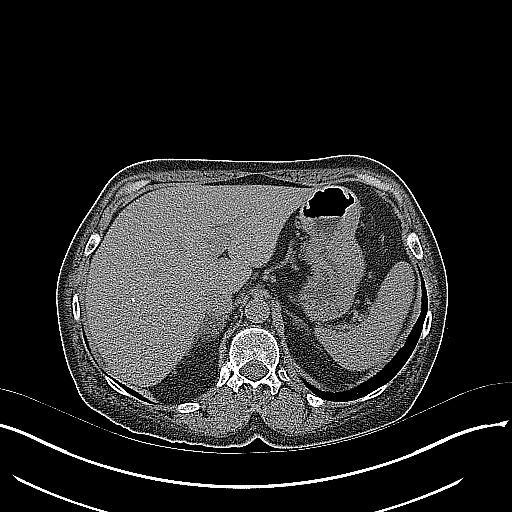
[im 18/46  soft-tissue]
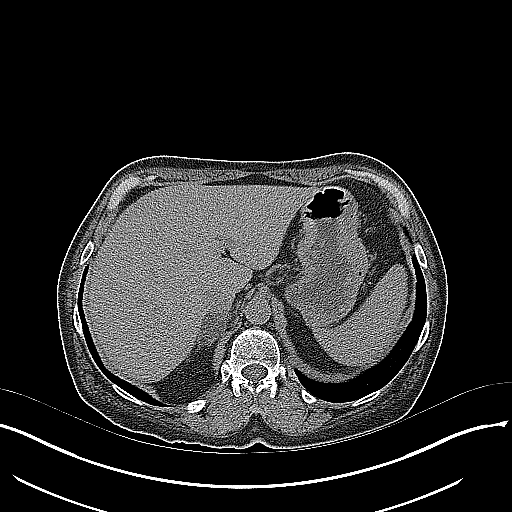
[im 21/46  soft-tissue]
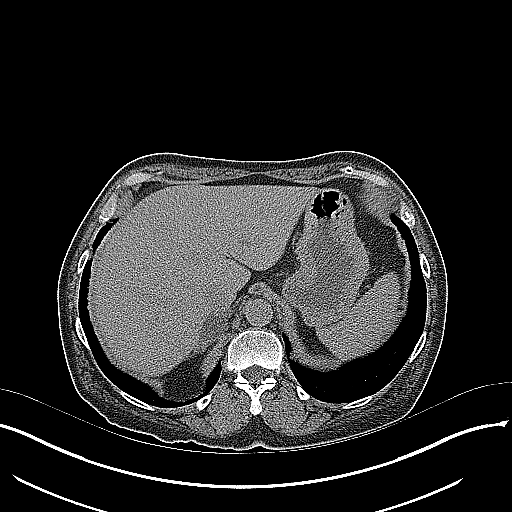
[im 25/46  soft-tissue]
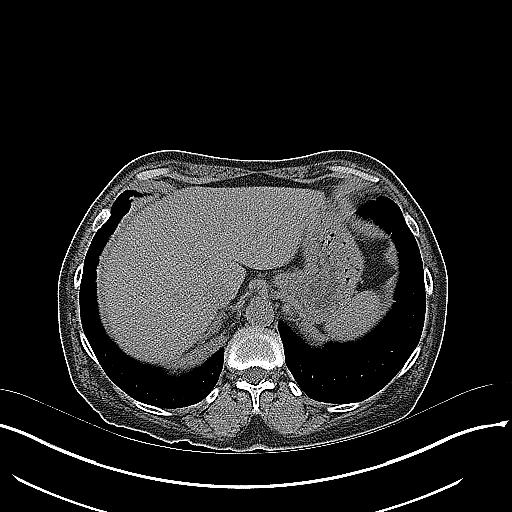
[im 28/46  soft-tissue]
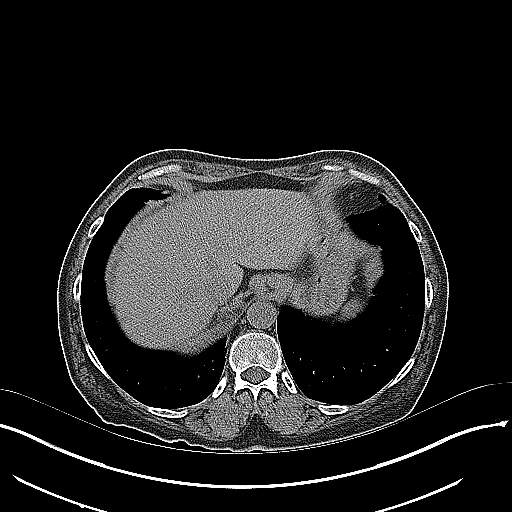
[im 28/46  bone]
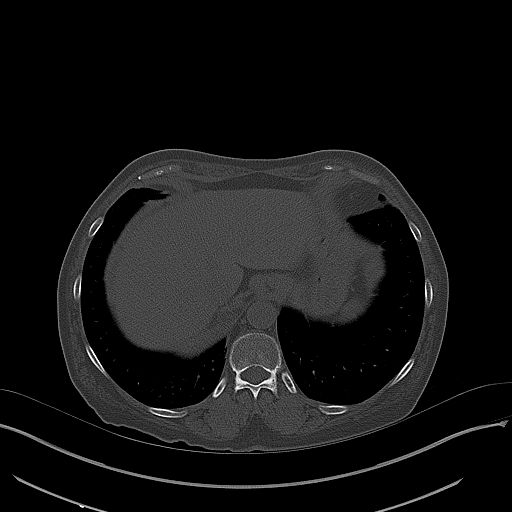
[im 31/46  soft-tissue]
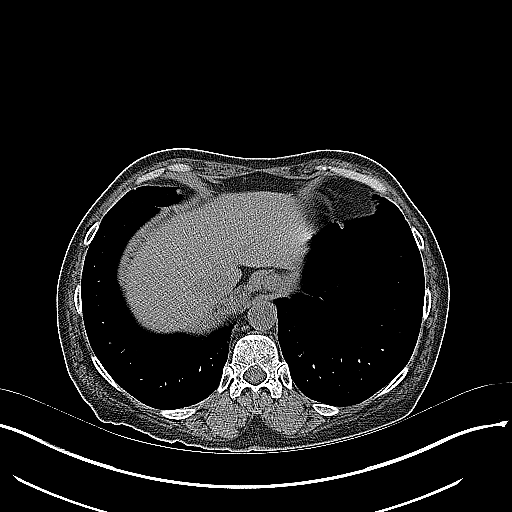
[im 34/46  soft-tissue]
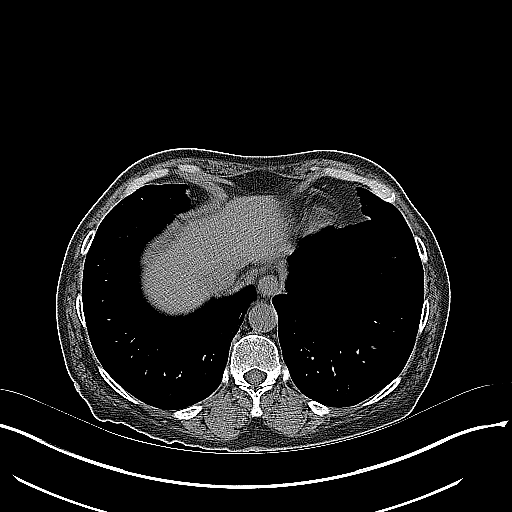
[im 37/46  soft-tissue]
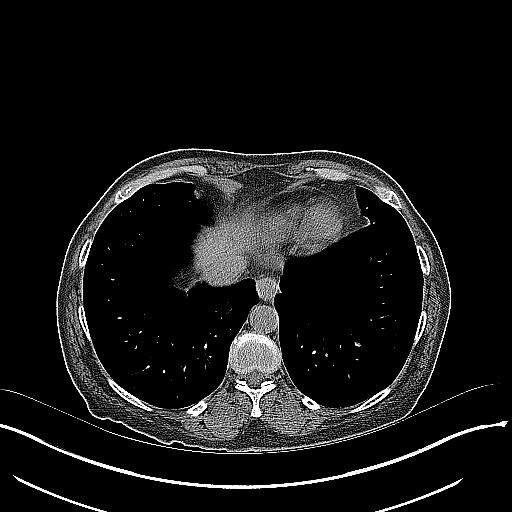
[im 40/46  soft-tissue]
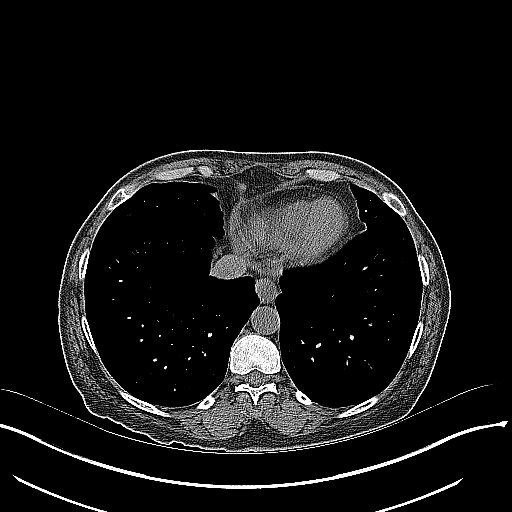
[im 43/46  soft-tissue]
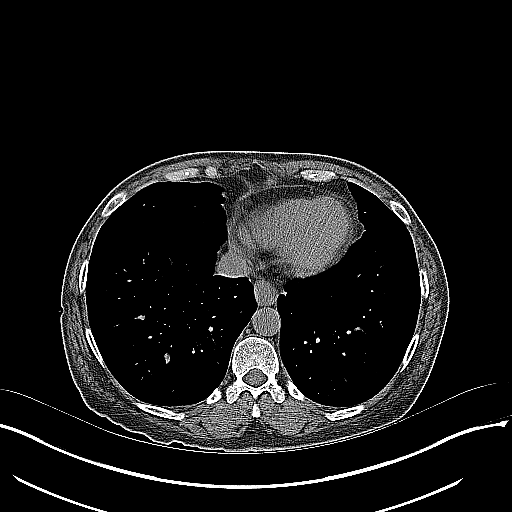

[17 of 46 positions shown; findings below may reference images not displayed]

FINDINGS: Lower chest: Slight peribronchial thickening at the lung bases.
Heart size is normal.

Hepatobiliary: No focal liver abnormality is seen. Status post
cholecystectomy. No biliary dilatation.

Pancreas: Unremarkable. No pancreatic ductal dilatation or
surrounding inflammatory changes.

Spleen: Normal in size without focal abnormality.

Adrenals/Urinary Tract: Bilateral adrenal adenomas. The largest is
on the left measures 2.3 cm, increased in size since 0220. Two small
stones in the mid to lower right kidney. There is no hydronephrosis.
No ureteral calculi. Bladder appears normal.

Stomach/Bowel: Stomach is within normal limits. Appendix appears
normal. No evidence of bowel wall thickening, distention, or
inflammatory changes.

Vascular/Lymphatic: Aortic atherosclerosis. No enlarged abdominal or
pelvic lymph nodes.

Reproductive: Uterus and left ovary appear to have been removed.
cm right ovary.

Other: No abdominal wall hernia or abnormality. No abdominopelvic
ascites.

Musculoskeletal: No acute or significant osseous findings.
IMPRESSION: 1. No acute abnormalities.
2. 2 small stones in the right kidney without evidence of ureteral
calculi or hydronephrosis.
3. Aortic atherosclerosis.

## 2019-11-02 ENCOUNTER — Ambulatory Visit: Payer: Self-pay | Attending: Internal Medicine

## 2019-11-02 DIAGNOSIS — Z23 Encounter for immunization: Secondary | ICD-10-CM | POA: Insufficient documentation

## 2019-11-02 NOTE — Progress Notes (Signed)
   Covid-19 Vaccination Clinic  Name:  Nicole Yates    MRN: [id number] DOB: Apr 06, 1956  11/02/2019  [name] was observed post Covid-19 immunization for 15 minutes without incidence. She was provided with Vaccine Information Sheet and instruction to access the V-Safe system.   [name] was instructed to call 911 with any severe reactions post vaccine: Marland Kitchen Difficulty breathing  . Swelling of your face and throat  . A fast heartbeat  . A bad rash all over your body  . Dizziness and weakness    Immunizations Administered    Name Date Dose VIS Date Route   Pfizer COVID-19 Vaccine 11/02/2019  9:00 AM 0.3 mL 08/30/2019 Intramuscular   Manufacturer: [employer], [employer]   Lot: [id number]   NDC: [address]-4

## 2019-11-25 ENCOUNTER — Ambulatory Visit: Payer: Self-pay | Attending: Internal Medicine

## 2019-11-25 DIAGNOSIS — Z23 Encounter for immunization: Secondary | ICD-10-CM | POA: Insufficient documentation

## 2019-11-25 NOTE — Progress Notes (Signed)
   Covid-19 Vaccination Clinic  Name:  Nicole Yates    MRN: [id number] DOB: [birth year]  11/25/2019  [name] was observed post Covid-19 immunization for 15 minutes without incident. She was provided with Vaccine Information Sheet and instruction to access the V-Safe system.   [name] was instructed to call 911 with any severe reactions post vaccine: Marland Kitchen Difficulty breathing  . Swelling of face and throat  . A fast heartbeat  . A bad rash all over body  . Dizziness and weakness   Immunizations Administered    Name Date Dose VIS Date Route   Pfizer COVID-19 Vaccine 11/25/2019  9:45 AM 0.3 mL 08/30/2019 Intramuscular   Manufacturer: [employer], [employer]   Lot: [id number]   NDC: [address]-1

## 2022-10-20 DEATH — deceased
